# Patient Record
Sex: Male | Born: 1945 | Race: White | Hispanic: No | Marital: Married | Smoking: Former smoker
Health system: Southern US, Community
[De-identification: ages and names within clinical notes are randomized; demographics above are authoritative.]

## PROBLEM LIST (undated history)

## (undated) DIAGNOSIS — N400 Enlarged prostate without lower urinary tract symptoms: Secondary | ICD-10-CM

## (undated) DIAGNOSIS — N189 Chronic kidney disease, unspecified: Secondary | ICD-10-CM

## (undated) DIAGNOSIS — M1990 Unspecified osteoarthritis, unspecified site: Secondary | ICD-10-CM

## (undated) DIAGNOSIS — G2581 Restless legs syndrome: Secondary | ICD-10-CM

## (undated) DIAGNOSIS — D649 Anemia, unspecified: Secondary | ICD-10-CM

## (undated) DIAGNOSIS — Z950 Presence of cardiac pacemaker: Secondary | ICD-10-CM

## (undated) DIAGNOSIS — K219 Gastro-esophageal reflux disease without esophagitis: Secondary | ICD-10-CM

## (undated) DIAGNOSIS — N529 Male erectile dysfunction, unspecified: Secondary | ICD-10-CM

## (undated) DIAGNOSIS — I10 Essential (primary) hypertension: Secondary | ICD-10-CM

## (undated) DIAGNOSIS — F329 Major depressive disorder, single episode, unspecified: Secondary | ICD-10-CM

## (undated) DIAGNOSIS — F32A Depression, unspecified: Secondary | ICD-10-CM

## (undated) HISTORY — PX: BACK SURGERY: SHX140

## (undated) HISTORY — PX: INSERT / REPLACE / REMOVE PACEMAKER: SUR710

---

## 2016-08-01 ENCOUNTER — Other Ambulatory Visit: Payer: Self-pay | Admitting: Orthopedic Surgery

## 2016-08-01 DIAGNOSIS — M5441 Lumbago with sciatica, right side: Secondary | ICD-10-CM

## 2016-08-01 DIAGNOSIS — M5442 Lumbago with sciatica, left side: Secondary | ICD-10-CM

## 2016-08-04 ENCOUNTER — Ambulatory Visit
Admission: RE | Admit: 2016-08-04 | Discharge: 2016-08-04 | Disposition: A | Discharge status: Home / Self Care | Payer: Medicare HMO | Source: Ambulatory Visit | Attending: Orthopedic Surgery | Admitting: Orthopedic Surgery

## 2016-08-04 ENCOUNTER — Other Ambulatory Visit: Payer: Self-pay | Admitting: Orthopedic Surgery

## 2016-08-04 DIAGNOSIS — M5441 Lumbago with sciatica, right side: Secondary | ICD-10-CM

## 2016-08-04 DIAGNOSIS — M5442 Lumbago with sciatica, left side: Secondary | ICD-10-CM

## 2016-08-04 DIAGNOSIS — M961 Postlaminectomy syndrome, not elsewhere classified: Secondary | ICD-10-CM

## 2016-08-05 ENCOUNTER — Ambulatory Visit
Admission: RE | Admit: 2016-08-05 | Discharge: 2016-08-05 | Disposition: A | Discharge status: Home / Self Care | Payer: Medicare HMO | Source: Ambulatory Visit | Attending: Orthopedic Surgery | Admitting: Orthopedic Surgery

## 2016-08-05 DIAGNOSIS — M961 Postlaminectomy syndrome, not elsewhere classified: Secondary | ICD-10-CM

## 2016-08-05 MED ORDER — MEPERIDINE HCL 100 MG/ML IJ SOLN
75.0000 mg | Freq: Once | INTRAMUSCULAR | Status: AC
  Administered 2016-08-05: 75 mg via INTRAMUSCULAR

## 2016-08-05 MED ORDER — DIAZEPAM 5 MG PO TABS
5.0000 mg | ORAL_TABLET | Freq: Once | ORAL | Status: AC
  Administered 2016-08-05: 5 mg via ORAL

## 2016-08-05 MED ORDER — IOPAMIDOL (ISOVUE-M 200) INJECTION 41%
15.0000 mL | Freq: Once | INTRAMUSCULAR | Status: AC
  Administered 2016-08-05: 15 mL via INTRATHECAL

## 2016-08-05 MED ORDER — ONDANSETRON HCL 4 MG/2ML IJ SOLN: 4.0000 mg | Freq: Four times a day (QID) | INTRAMUSCULAR | Status: DC | PRN

## 2016-08-05 MED ORDER — ONDANSETRON HCL 4 MG/2ML IJ SOLN
4.0000 mg | Freq: Once | INTRAMUSCULAR | Status: AC
  Administered 2016-08-05: 4 mg via INTRAMUSCULAR

## 2016-08-05 NOTE — Discharge Instructions (Signed)

## 2016-08-07 ENCOUNTER — Telehealth: Payer: Self-pay | Admitting: Radiology

## 2016-08-07 NOTE — Telephone Encounter (Signed)
Denies any problems or headache post myelo

## 2016-08-20 ENCOUNTER — Ambulatory Visit: Payer: Self-pay | Admitting: Physician Assistant

## 2016-09-01 ENCOUNTER — Encounter (HOSPITAL_COMMUNITY)
Admission: RE | Admit: 2016-09-01 | Discharge: 2016-09-01 | Disposition: A | Discharge status: Home / Self Care | Payer: MEDICARE | Source: Ambulatory Visit | Attending: Orthopedic Surgery | Admitting: Orthopedic Surgery

## 2016-09-01 ENCOUNTER — Inpatient Hospital Stay (HOSPITAL_COMMUNITY)
Admission: RE | Admit: 2016-09-01 | Discharge: 2016-09-05 | DRG: 030 | Disposition: A | Discharge status: Home / Self Care | Payer: MEDICARE | Source: Ambulatory Visit | Attending: Orthopedic Surgery | Admitting: Orthopedic Surgery

## 2016-09-01 ENCOUNTER — Encounter (HOSPITAL_COMMUNITY): Payer: Self-pay

## 2016-09-01 DIAGNOSIS — M40209 Unspecified kyphosis, site unspecified: Secondary | ICD-10-CM | POA: Diagnosis present

## 2016-09-01 DIAGNOSIS — G8929 Other chronic pain: Secondary | ICD-10-CM | POA: Diagnosis present

## 2016-09-01 DIAGNOSIS — G894 Chronic pain syndrome: Principal | ICD-10-CM | POA: Diagnosis present

## 2016-09-01 DIAGNOSIS — I10 Essential (primary) hypertension: Secondary | ICD-10-CM | POA: Diagnosis present

## 2016-09-01 DIAGNOSIS — Z7982 Long term (current) use of aspirin: Secondary | ICD-10-CM | POA: Diagnosis not present

## 2016-09-01 DIAGNOSIS — Z950 Presence of cardiac pacemaker: Secondary | ICD-10-CM | POA: Diagnosis not present

## 2016-09-01 DIAGNOSIS — Z87891 Personal history of nicotine dependence: Secondary | ICD-10-CM | POA: Insufficient documentation

## 2016-09-01 DIAGNOSIS — M545 Low back pain, unspecified: Secondary | ICD-10-CM

## 2016-09-01 DIAGNOSIS — Z01812 Encounter for preprocedural laboratory examination: Secondary | ICD-10-CM

## 2016-09-01 DIAGNOSIS — K219 Gastro-esophageal reflux disease without esophagitis: Secondary | ICD-10-CM | POA: Diagnosis present

## 2016-09-01 DIAGNOSIS — M961 Postlaminectomy syndrome, not elsewhere classified: Secondary | ICD-10-CM | POA: Insufficient documentation

## 2016-09-01 DIAGNOSIS — M549 Dorsalgia, unspecified: Secondary | ICD-10-CM | POA: Diagnosis present

## 2016-09-01 DIAGNOSIS — Z79899 Other long term (current) drug therapy: Secondary | ICD-10-CM

## 2016-09-01 HISTORY — DX: Anemia, unspecified: D64.9

## 2016-09-01 HISTORY — DX: Essential (primary) hypertension: I10

## 2016-09-01 HISTORY — DX: Restless legs syndrome: G25.81

## 2016-09-01 HISTORY — DX: Chronic kidney disease, unspecified: N18.9

## 2016-09-01 HISTORY — DX: Gastro-esophageal reflux disease without esophagitis: K21.9

## 2016-09-01 HISTORY — DX: Unspecified osteoarthritis, unspecified site: M19.90

## 2016-09-01 HISTORY — DX: Benign prostatic hyperplasia without lower urinary tract symptoms: N40.0

## 2016-09-01 HISTORY — DX: Depression, unspecified: F32.A

## 2016-09-01 HISTORY — DX: Presence of cardiac pacemaker: Z95.0

## 2016-09-01 HISTORY — DX: Male erectile dysfunction, unspecified: N52.9

## 2016-09-01 HISTORY — DX: Major depressive disorder, single episode, unspecified: F32.9

## 2016-09-01 LAB — SURGICAL PCR SCREEN
MRSA, PCR: NEGATIVE
STAPHYLOCOCCUS AUREUS: POSITIVE — AB

## 2016-09-01 NOTE — Progress Notes (Signed)
Pacemaker form faxed to DR Preli. medtonic notified. Also labs in care everywhere.[facility]

## 2016-09-01 NOTE — Pre-Procedure Instructions (Signed)
    Deri FuellingClark Menter  09/01/2016      [facility] [phone] - [place] - [address] [facility] [address] [place] [id number] Phone: [phone] Fax: [phone]    Your procedure is scheduled on 09/03/16.  [facility] Admitting at 12 noon  Call this number if you have problems the morning of surgery:  [phone]   Remember:  Do not eat food or drink liquids after midnight.  Take these medicines the morning of surgery with A SIP OF WATER --valium,cymbalta,neurontin,oxycodone,protonix,flomax   Do not wear jewelry, make-up or nail polish.  Do not wear lotions, powders, or perfumes, or deoderant.  Do not shave 48 hours prior to surgery.  Men may shave face and neck.  Do not bring valuables to the hospital.  [facility] is not responsible for any belongings or valuables.  Contacts, dentures or bridgework may not be worn into surgery.  Leave your suitcase in the car.  After surgery it may be brought to your room.  For patients admitted to the hospital, discharge time will be determined by your treatment team.  Patients discharged the day of surgery will not be allowed to drive home.   Name and phone number of your driver:    Special instructions:  Do not take any aspirin,anti-inflammatories,vitamins,or herbal supplements 5-7 days prior to surgery.  Please read over the following fact sheets that you were given. MRSA Information

## 2016-09-02 NOTE — Progress Notes (Signed)
Anesthesia chart review: Patient is a 70 year old male scheduled for placement of spinal cord stimulator on 09/03/2016 by Dr. Shon Baton.   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 Carlos Levering with [facility]), GERD, depression, anemia, chronic kidney disease stage III, RLS, ED, BPH, L2-5 laminectomies with L2-4 PLIF 05/11/13 [facility]), re-exploration of decompressive L3-S1 laminectomies and removal of hardware 01/14/15 with readmission 01/27/15 with [facility]), L1-3 laminectomies 05/09/15 and C5 anterior corpectomy with C4-6 ACDF 07/28/15 [facility]). Prior cardiology notes from 55 and 2016 [facility]) indicate that PPM interrogations in the past has showed PAF (1%) with CHADS-VAS = 2. Anticoagulation was not considered at that time, partially due to history of falls. They also indicate that patient has a history of type II NSTEMI (demand ischemia) with coronary calcifications on chest CTA in 2003.   - PCP is Dr. Simmie Davies with [facility], last visit 08/27/16 for CPE (pre-op exam visit 08/13/16). He is aware of surgery plans, and signed a clearance note for surgery with recommendation for close BP monitoring. - Cardiologist is Dr. Juliane Lack with [facility], last visit 12/17/15 (newly established; see Care Everywhere). One year follow-up recommended. (Records indicate he had seen Dr. Rebecca Eaton in the past, and previous visit was with Dr. Collie Siad on 07/11/15 with [facility]. He doesn't think that he has had a cardiac cath. There was not record of a cardiac cath in Care Everywhere or [facility]. There was no cardiac records at [facility]. Last stress test in 2015.)  On 08/07/16 (Care Everywhere), "I have reviewed this device check and it shows adequate device function" Lear Ng, MD).   12/17/15 EKG ([facility]; Care Everywhere): Result Narrative  ONLY: (Tracing requested) Electronic ventricular pacemaker  Pacemaker ECG, No further analysis  INSUFFICIENT DATA  01/29/15 Echo [facility]; Care Everywhere): Interpretation Summary A complete portable two-dimensional transthoracic echocardiogram was performed. The study was technically adequate. Compared to prior there is mildly improved LV function. The mitral valve is normal in structure with mild to moderate [1-2+] regurgitation. There is a pacemaker lead in the right ventricle. The aortic valve is not well visualized, but is grossly normal. The tricupid valve leaflets are structurally normal with moderate (2+) regurgitation. The left ventricle is mildly dilated. There is normal left ventricular wall thickness. The left ventricular ejection fraction is normal (50-55%). The left ventricular diastolic function is abnormal. The left atrium is moderately dilated. The right atrium is moderately dilated. Right ventricular systolic pressure is elevated between 40-86mm Hg, consistent with moderate pulmonary hypertension.  03/01/14 Nuclear stress test [facility]; done at [facility]): Impression: 1. The study is inconclusive for myocardial ischemia given the relative diffuse difference in radiotracer count density on the stress versus rest imaging. 2. Normal left ventricular function with calculated EF of 55%. 3. No focal reversible perfusion defects.  11/17/15 CT Chest/Abd/Pelvis ([facility]; Care Everywhere): IMPRESSION: 1. No acute infiltrate or effusion. 2. Stable atherosclerotic disease. 3. No significant change postoperative/degenerative changes lumbar spine. 4. Diverticulosis sigmoid colon.  11/17/15 CXR ([facility]; Care Everywhere): FINDINGS: Image obtained in mid expiration. Cardiac size stable. Stable right-sided pacemaker. Mild decrease bibasilar infiltrate/atelectasis. IMPRESSION: Mild decrease bibasilar infiltrate/atelectasis.  Patient had  labs on 08/27/16 as part of his CPE with Dr. Magdalene Patricia [facility]; see Care Everywhere). Labs showed WBC 4.5, hemoglobin 11.7, hematocrit 34.7, platelet count 229. BMI of 30, creatinine 1.73 (Cr 1.27-2.15  since 01/2015; although most recently in the 1.3-1.4 range), potassium 4.3, AST 38, ALP 21, glucose 97.  January EKG tracing and PPM perioperative device form is still pending from Dr. Reuben LikesPreli's office. If no EKG tracing received within the past year, then he will need a baseline tracing done prior to surgery.   Velna Ochsllison Zohaib Heeney, PA-C [facility]/Anesthesiology Phone [phone] 09/02/2016 1:15 PM

## 2016-09-03 ENCOUNTER — Observation Stay (HOSPITAL_COMMUNITY): Payer: MEDICARE

## 2016-09-03 ENCOUNTER — Encounter (HOSPITAL_COMMUNITY)
Admission: RE | Disposition: A | Discharge status: Home / Self Care | Payer: Self-pay | Source: Ambulatory Visit | Attending: Orthopedic Surgery

## 2016-09-03 ENCOUNTER — Ambulatory Visit (HOSPITAL_COMMUNITY): Payer: MEDICARE | Admitting: Vascular Surgery

## 2016-09-03 ENCOUNTER — Encounter (HOSPITAL_COMMUNITY): Payer: Self-pay | Admitting: *Deleted

## 2016-09-03 ENCOUNTER — Ambulatory Visit (HOSPITAL_COMMUNITY): Payer: MEDICARE | Admitting: Anesthesiology

## 2016-09-03 DIAGNOSIS — M40209 Unspecified kyphosis, site unspecified: Secondary | ICD-10-CM | POA: Diagnosis not present

## 2016-09-03 DIAGNOSIS — K219 Gastro-esophageal reflux disease without esophagitis: Secondary | ICD-10-CM | POA: Diagnosis not present

## 2016-09-03 DIAGNOSIS — M549 Dorsalgia, unspecified: Secondary | ICD-10-CM | POA: Diagnosis present

## 2016-09-03 DIAGNOSIS — I10 Essential (primary) hypertension: Secondary | ICD-10-CM | POA: Diagnosis not present

## 2016-09-03 DIAGNOSIS — G894 Chronic pain syndrome: Secondary | ICD-10-CM | POA: Diagnosis not present

## 2016-09-03 HISTORY — PX: SPINAL CORD STIMULATOR INSERTION: SHX5378

## 2016-09-03 SURGERY — INSERTION, SPINAL CORD STIMULATOR, LUMBAR
Anesthesia: General | Site: Back

## 2016-09-03 MED ORDER — MENTHOL 3 MG MT LOZG: 1.0000 | LOZENGE | OROMUCOSAL | Status: DC | PRN

## 2016-09-03 MED ORDER — LIDOCAINE HCL (CARDIAC) 20 MG/ML IV SOLN
INTRAVENOUS | Status: DC | PRN
  Administered 2016-09-03: 60 mg via INTRAVENOUS

## 2016-09-03 MED ORDER — GABAPENTIN 300 MG PO CAPS
300.0000 mg | ORAL_CAPSULE | Freq: Two times a day (BID) | ORAL | Status: DC
  Administered 2016-09-03 – 2016-09-05 (×4): 300 mg via ORAL
  Filled 2016-09-03 (×5): qty 1

## 2016-09-03 MED ORDER — THROMBIN 20000 UNITS EX SOLR
CUTANEOUS | Status: DC | PRN
  Administered 2016-09-03: 20000 [IU] via TOPICAL

## 2016-09-03 MED ORDER — ACETAMINOPHEN 10 MG/ML IV SOLN
INTRAVENOUS | Status: DC | PRN
  Administered 2016-09-03: 1000 mg via INTRAVENOUS

## 2016-09-03 MED ORDER — DULOXETINE HCL 60 MG PO CPEP
60.0000 mg | ORAL_CAPSULE | Freq: Every day | ORAL | Status: DC
  Administered 2016-09-03 – 2016-09-05 (×3): 60 mg via ORAL
  Filled 2016-09-03 (×3): qty 1

## 2016-09-03 MED ORDER — FENTANYL CITRATE (PF) 100 MCG/2ML IJ SOLN
25.0000 ug | INTRAMUSCULAR | Status: DC | PRN
  Administered 2016-09-03 (×2): 50 ug via INTRAVENOUS

## 2016-09-03 MED ORDER — ROPINIROLE HCL 1 MG PO TABS
3.0000 mg | ORAL_TABLET | ORAL | Status: DC
  Administered 2016-09-03 – 2016-09-05 (×4): 3 mg via ORAL
  Filled 2016-09-03 (×5): qty 3

## 2016-09-03 MED ORDER — DEXAMETHASONE SODIUM PHOSPHATE 10 MG/ML IJ SOLN
INTRAMUSCULAR | Status: DC | PRN
  Administered 2016-09-03: 8 mg via INTRAVENOUS

## 2016-09-03 MED ORDER — ACETAMINOPHEN 10 MG/ML IV SOLN
INTRAVENOUS | Status: AC
  Filled 2016-09-03: qty 100

## 2016-09-03 MED ORDER — HEMOSTATIC AGENTS (NO CHARGE) OPTIME
TOPICAL | Status: DC | PRN
  Administered 2016-09-03: 1 via TOPICAL

## 2016-09-03 MED ORDER — TAMSULOSIN HCL 0.4 MG PO CAPS
0.4000 mg | ORAL_CAPSULE | Freq: Every day | ORAL | Status: DC
  Administered 2016-09-03 – 2016-09-04 (×2): 0.4 mg via ORAL
  Filled 2016-09-03 (×2): qty 1

## 2016-09-03 MED ORDER — EPHEDRINE 5 MG/ML INJ
INTRAVENOUS | Status: AC
  Filled 2016-09-03: qty 10

## 2016-09-03 MED ORDER — CEFAZOLIN IN D5W 1 GM/50ML IV SOLN
1.0000 g | Freq: Three times a day (TID) | INTRAVENOUS | Status: AC
  Administered 2016-09-03 – 2016-09-04 (×2): 1 g via INTRAVENOUS
  Filled 2016-09-03 (×2): qty 50

## 2016-09-03 MED ORDER — ONDANSETRON HCL 4 MG/2ML IJ SOLN: 4.0000 mg | Freq: Once | INTRAMUSCULAR | Status: DC | PRN

## 2016-09-03 MED ORDER — MIDAZOLAM HCL 5 MG/5ML IJ SOLN
INTRAMUSCULAR | Status: DC | PRN
  Administered 2016-09-03: 2 mg via INTRAVENOUS

## 2016-09-03 MED ORDER — ONDANSETRON HCL 4 MG/2ML IJ SOLN
INTRAMUSCULAR | Status: AC
  Filled 2016-09-03: qty 2

## 2016-09-03 MED ORDER — PANTOPRAZOLE SODIUM 40 MG PO TBEC
40.0000 mg | DELAYED_RELEASE_TABLET | Freq: Every day | ORAL | Status: DC
  Administered 2016-09-03 – 2016-09-05 (×3): 40 mg via ORAL
  Filled 2016-09-03 (×3): qty 1

## 2016-09-03 MED ORDER — SODIUM CHLORIDE 0.9% FLUSH: 3.0000 mL | INTRAVENOUS | Status: DC | PRN

## 2016-09-03 MED ORDER — PHENYLEPHRINE HCL 10 MG/ML IJ SOLN
INTRAMUSCULAR | Status: DC | PRN
  Administered 2016-09-03 (×2): 80 ug via INTRAVENOUS
  Administered 2016-09-03: 40 ug via INTRAVENOUS
  Administered 2016-09-03: 80 ug via INTRAVENOUS
  Administered 2016-09-03: 40 ug via INTRAVENOUS
  Administered 2016-09-03 (×2): 80 ug via INTRAVENOUS

## 2016-09-03 MED ORDER — CEFAZOLIN SODIUM-DEXTROSE 2-4 GM/100ML-% IV SOLN
2.0000 g | INTRAVENOUS | Status: AC
  Administered 2016-09-03: 2 g via INTRAVENOUS
  Filled 2016-09-03: qty 100

## 2016-09-03 MED ORDER — DEXAMETHASONE 4 MG PO TABS
4.0000 mg | ORAL_TABLET | Freq: Four times a day (QID) | ORAL | Status: DC
  Administered 2016-09-04: 4 mg via ORAL
  Filled 2016-09-03 (×2): qty 1

## 2016-09-03 MED ORDER — PHENYLEPHRINE 40 MCG/ML (10ML) SYRINGE FOR IV PUSH (FOR BLOOD PRESSURE SUPPORT)
PREFILLED_SYRINGE | INTRAVENOUS | Status: AC
  Filled 2016-09-03: qty 20

## 2016-09-03 MED ORDER — OXYCODONE HCL 5 MG PO TABS
15.0000 mg | ORAL_TABLET | Freq: Three times a day (TID) | ORAL | Status: DC | PRN
  Administered 2016-09-04: 30 mg via ORAL
  Filled 2016-09-03: qty 6

## 2016-09-03 MED ORDER — OXYCODONE HCL 5 MG PO TABS: 15.0000 mg | ORAL_TABLET | Freq: Once | ORAL | Status: DC | PRN

## 2016-09-03 MED ORDER — DIAZEPAM 5 MG PO TABS
10.0000 mg | ORAL_TABLET | Freq: Three times a day (TID) | ORAL | Status: DC | PRN
  Administered 2016-09-03 – 2016-09-05 (×4): 10 mg via ORAL
  Filled 2016-09-03 (×4): qty 2

## 2016-09-03 MED ORDER — ROCURONIUM BROMIDE 100 MG/10ML IV SOLN
INTRAVENOUS | Status: DC | PRN
  Administered 2016-09-03: 40 mg via INTRAVENOUS
  Administered 2016-09-03 (×2): 10 mg via INTRAVENOUS

## 2016-09-03 MED ORDER — DEXAMETHASONE SODIUM PHOSPHATE 10 MG/ML IJ SOLN
INTRAMUSCULAR | Status: AC
  Filled 2016-09-03: qty 1

## 2016-09-03 MED ORDER — ONDANSETRON HCL 4 MG/2ML IJ SOLN
INTRAMUSCULAR | Status: DC | PRN
  Administered 2016-09-03: 4 mg via INTRAVENOUS

## 2016-09-03 MED ORDER — FENTANYL CITRATE (PF) 100 MCG/2ML IJ SOLN
INTRAMUSCULAR | Status: AC
  Filled 2016-09-03: qty 2

## 2016-09-03 MED ORDER — DEXAMETHASONE SODIUM PHOSPHATE 4 MG/ML IJ SOLN
4.0000 mg | Freq: Four times a day (QID) | INTRAMUSCULAR | Status: DC
  Administered 2016-09-03 – 2016-09-05 (×6): 4 mg via INTRAVENOUS
  Filled 2016-09-03 (×6): qty 1

## 2016-09-03 MED ORDER — MORPHINE-NALTREXONE 20-0.8 MG PO CPCR: 1.0000 | ORAL_CAPSULE | Freq: Two times a day (BID) | ORAL | Status: DC

## 2016-09-03 MED ORDER — FENTANYL CITRATE (PF) 100 MCG/2ML IJ SOLN
INTRAMUSCULAR | Status: DC | PRN
  Administered 2016-09-03: 50 ug via INTRAVENOUS
  Administered 2016-09-03: 25 ug via INTRAVENOUS
  Administered 2016-09-03: 50 ug via INTRAVENOUS
  Administered 2016-09-03: 100 ug via INTRAVENOUS
  Administered 2016-09-03: 50 ug via INTRAVENOUS
  Administered 2016-09-03: 25 ug via INTRAVENOUS

## 2016-09-03 MED ORDER — SUCCINYLCHOLINE CHLORIDE 200 MG/10ML IV SOSY
PREFILLED_SYRINGE | INTRAVENOUS | Status: AC
  Filled 2016-09-03: qty 20

## 2016-09-03 MED ORDER — BUPIVACAINE-EPINEPHRINE 0.25% -1:200000 IJ SOLN
INTRAMUSCULAR | Status: DC | PRN
  Administered 2016-09-03: 10 mL

## 2016-09-03 MED ORDER — 0.9 % SODIUM CHLORIDE (POUR BTL) OPTIME
TOPICAL | Status: DC | PRN
  Administered 2016-09-03: 1000 mL

## 2016-09-03 MED ORDER — PHENOL 1.4 % MT LIQD: 1.0000 | OROMUCOSAL | Status: DC | PRN

## 2016-09-03 MED ORDER — LACTATED RINGERS IV SOLN: INTRAVENOUS | Status: DC

## 2016-09-03 MED ORDER — SODIUM CHLORIDE 0.9% FLUSH
3.0000 mL | Freq: Two times a day (BID) | INTRAVENOUS | Status: DC
  Administered 2016-09-03 – 2016-09-05 (×4): 3 mL via INTRAVENOUS

## 2016-09-03 MED ORDER — MIDAZOLAM HCL 2 MG/2ML IJ SOLN
INTRAMUSCULAR | Status: AC
  Filled 2016-09-03: qty 2

## 2016-09-03 MED ORDER — SUGAMMADEX SODIUM 200 MG/2ML IV SOLN
INTRAVENOUS | Status: AC
  Filled 2016-09-03: qty 2

## 2016-09-03 MED ORDER — THROMBIN 20000 UNITS EX SOLR
CUTANEOUS | Status: AC
  Filled 2016-09-03: qty 20000

## 2016-09-03 MED ORDER — SODIUM CHLORIDE 0.9 % IV SOLN
INTRAVENOUS | Status: DC | PRN
  Administered 2016-09-03: 17:00:00 via INTRAVENOUS

## 2016-09-03 MED ORDER — OXYCODONE HCL 5 MG/5ML PO SOLN: 15.0000 mg | Freq: Once | ORAL | Status: DC | PRN

## 2016-09-03 MED ORDER — BUPIVACAINE-EPINEPHRINE (PF) 0.25% -1:200000 IJ SOLN
INTRAMUSCULAR | Status: AC
  Filled 2016-09-03: qty 30

## 2016-09-03 MED ORDER — ONDANSETRON HCL 4 MG/2ML IJ SOLN
4.0000 mg | INTRAMUSCULAR | Status: DC | PRN
  Administered 2016-09-04 (×2): 4 mg via INTRAVENOUS
  Filled 2016-09-03 (×2): qty 2

## 2016-09-03 MED ORDER — MORPHINE SULFATE ER 15 MG PO TBCR
15.0000 mg | EXTENDED_RELEASE_TABLET | Freq: Two times a day (BID) | ORAL | Status: DC
  Administered 2016-09-03 – 2016-09-04 (×2): 15 mg via ORAL
  Filled 2016-09-03 (×2): qty 1

## 2016-09-03 MED ORDER — PROPOFOL 10 MG/ML IV BOLUS
INTRAVENOUS | Status: AC
  Filled 2016-09-03: qty 40

## 2016-09-03 MED ORDER — SODIUM CHLORIDE 0.9 % IV SOLN: 250.0000 mL | INTRAVENOUS | Status: DC

## 2016-09-03 MED ORDER — ROCURONIUM BROMIDE 10 MG/ML (PF) SYRINGE
PREFILLED_SYRINGE | INTRAVENOUS | Status: AC
  Filled 2016-09-03: qty 10

## 2016-09-03 MED ORDER — PHENYLEPHRINE 40 MCG/ML (10ML) SYRINGE FOR IV PUSH (FOR BLOOD PRESSURE SUPPORT)
PREFILLED_SYRINGE | INTRAVENOUS | Status: AC
  Filled 2016-09-03: qty 40

## 2016-09-03 MED ORDER — PROPOFOL 10 MG/ML IV BOLUS
INTRAVENOUS | Status: DC | PRN
  Administered 2016-09-03: 170 mg via INTRAVENOUS

## 2016-09-03 SURGICAL SUPPLY — 69 items
ANCHOR CLICK (Anchor) ×2 IMPLANT
ANCHOR CLIK NEURO F/LEAD 2 (Anchor) ×1 IMPLANT
CANISTER SUCTION 2500CC (MISCELLANEOUS) ×3 IMPLANT
CLOSURE STERI-STRIP 1/2X4 (GAUZE/BANDAGES/DRESSINGS) ×2
CLSR STERI-STRIP ANTIMIC 1/2X4 (GAUZE/BANDAGES/DRESSINGS) ×4 IMPLANT
COVER PROBE W GEL 5X96 (DRAPES) IMPLANT
COVER SURGICAL LIGHT HANDLE (MISCELLANEOUS) ×3 IMPLANT
DRAPE C-ARM 42X72 X-RAY (DRAPES) ×3 IMPLANT
DRAPE C-ARMOR (DRAPES) ×3 IMPLANT
DRAPE INCISE IOBAN 85X60 (DRAPES) ×3 IMPLANT
DRAPE SURG 17X23 STRL (DRAPES) ×3 IMPLANT
DRAPE U-SHAPE 47X51 STRL (DRAPES) ×3 IMPLANT
DRSG AQUACEL AG ADV 3.5X 4 (GAUZE/BANDAGES/DRESSINGS) ×3 IMPLANT
DRSG AQUACEL AG ADV 3.5X 6 (GAUZE/BANDAGES/DRESSINGS) ×3 IMPLANT
DRSG AQUACEL AG ADV 3.5X10 (GAUZE/BANDAGES/DRESSINGS) ×3 IMPLANT
DURAPREP 26ML APPLICATOR (WOUND CARE) ×3 IMPLANT
ELECT BLADE 4.0 EZ CLEAN MEGAD (MISCELLANEOUS) ×3
ELECT CAUTERY BLADE 6.4 (BLADE) ×3 IMPLANT
ELECT PENCIL ROCKER SW 15FT (MISCELLANEOUS) ×3 IMPLANT
ELECT REM PT RETURN 9FT ADLT (ELECTROSURGICAL) ×3
ELECTRODE BLDE 4.0 EZ CLN MEGD (MISCELLANEOUS) ×1 IMPLANT
ELECTRODE REM PT RTRN 9FT ADLT (ELECTROSURGICAL) ×1 IMPLANT
ELEVATER PASSER (SPINAL CORD STIMULATOR) ×3
GLOVE BIOGEL PI IND STRL 8.5 (GLOVE) ×1 IMPLANT
GLOVE BIOGEL PI INDICATOR 8.5 (GLOVE) ×2
GLOVE SS N UNI LF 8.5 STRL (GLOVE) ×6 IMPLANT
GOWN STRL REUS W/TWL 2XL LVL3 (GOWN DISPOSABLE) ×3 IMPLANT
IPG PRECISION SPECTRA (Stimulator) ×3 IMPLANT
KIT BASIN OR (CUSTOM PROCEDURE TRAY) ×3 IMPLANT
KIT CHARGING (KITS) ×2
KIT CHARGING PRECISION NEURO (KITS) ×1 IMPLANT
KIT PAT PROGRAM FREELINK (KITS) ×1 IMPLANT
KIT ROOM TURNOVER OR (KITS) ×3 IMPLANT
LEAD PERCUTANEOUS (Neuro Prosthesis/Implant) ×6 IMPLANT
LEAD SURG ARTISAN 70CM (SPINAL CORD STIMULATOR) ×3 IMPLANT
NEEDLE 22X1 1/2 (OR ONLY) (NEEDLE) ×3 IMPLANT
NEEDLE SPNL 18GX3.5 QUINCKE PK (NEEDLE) ×9 IMPLANT
NS IRRIG 1000ML POUR BTL (IV SOLUTION) ×3 IMPLANT
PACK LAMINECTOMY ORTHO (CUSTOM PROCEDURE TRAY) ×3 IMPLANT
PACK UNIVERSAL I (CUSTOM PROCEDURE TRAY) ×3 IMPLANT
PAD ARMBOARD 7.5X6 YLW CONV (MISCELLANEOUS) ×9 IMPLANT
PADDLE BLANK SIMULATOR LEAD 32 (MISCELLANEOUS) ×3 IMPLANT
PASSER ELEVATOR (SPINAL CORD STIMULATOR) ×1 IMPLANT
PATTIES SURGICAL .5 X1 (DISPOSABLE) ×3 IMPLANT
REMOTE CONTROL KIT (KITS) ×3
SPATULA SILICONE BRAIN 10MM (MISCELLANEOUS) ×3 IMPLANT
SPONGE LAP 4X18 X RAY DECT (DISPOSABLE) IMPLANT
SPONGE SURGIFOAM ABS GEL 100 (HEMOSTASIS) ×3 IMPLANT
STAPLER VISISTAT 35W (STAPLE) ×3 IMPLANT
SURGIFLO W/THROMBIN 8M KIT (HEMOSTASIS) ×6 IMPLANT
SUT BONE WAX W31G (SUTURE) ×3 IMPLANT
SUT FIBERWIRE #2 38 T-5 BLUE (SUTURE) ×3
SUT MNCRL AB 3-0 PS2 18 (SUTURE) ×3 IMPLANT
SUT MON AB 3-0 SH 27 (SUTURE) ×2
SUT MON AB 3-0 SH27 (SUTURE) ×1 IMPLANT
SUT VIC AB 1 CT1 18XCR BRD 8 (SUTURE) ×2 IMPLANT
SUT VIC AB 1 CT1 27 (SUTURE) ×2
SUT VIC AB 1 CT1 27XBRD ANBCTR (SUTURE) ×1 IMPLANT
SUT VIC AB 1 CT1 8-18 (SUTURE) ×4
SUT VIC AB 2-0 CT1 18 (SUTURE) ×9 IMPLANT
SUTURE FIBERWR #2 38 T-5 BLUE (SUTURE) ×1 IMPLANT
SYR BULB IRRIGATION 50ML (SYRINGE) ×3 IMPLANT
SYR CONTROL 10ML LL (SYRINGE) ×3 IMPLANT
TOOL LONG TUNNEL (SPINAL CORD STIMULATOR) ×3 IMPLANT
TOWEL OR 17X24 6PK STRL BLUE (TOWEL DISPOSABLE) ×3 IMPLANT
TOWEL OR 17X26 10 PK STRL BLUE (TOWEL DISPOSABLE) ×3 IMPLANT
TRAY FOLEY CATH 16FRSI W/METER (SET/KITS/TRAYS/PACK) IMPLANT
WATER STERILE IRR 1000ML POUR (IV SOLUTION) ×3 IMPLANT
WRENCH HEX 7.6 (SPINAL CORD STIMULATOR) ×3 IMPLANT

## 2016-09-03 NOTE — H&P (Signed)
History of Present Illness The patient is a 70 year old male who comes in today for a preoperative History and Physical. The patient is scheduled for a Permanent Implantation of SCS to be performed by Dr. Debria Garretahari D. Shon BatonBrooks, [facility]  on 09/03/16 . Please see the hospital record for complete dictated history and physical. Pt has a pacemaker.  Transition into care is described as the following: The patient is transitioning into care and a summary of care was reviewed.   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 Depression  Father, Mother. Drug / Alcohol Addiction  Father. First Degree Relatives  reported Heart disease in male family member before age 71  Hypertension  Brother, Mother. Osteoporosis  Mother.  Social History  Tobacco use  Former smoker. 07/25/2016: smoke(d) 1 pack(s) per day Tobacco / smoke exposure  07/25/2016: no Children  1 Current drinker  07/25/2016: Currently drinks hard liquor less than 5 times per week Current work status  retired Exercise  Exercises rarely; does individual sport Living situation  live with spouse Marital status  married No history of drug/alcohol rehab  Number of flights of stairs before winded  greater than 5 Under pain contract   Medication History ROPINIRole HCl (3MG  Tablet, Oral) Active. Losartan Potassium (100MG  Tablet, Oral) Active. Pantoprazole Sodium (40MG  Tablet DR, Oral) Active. Fenofibrate (160MG  Tablet, Oral) Active. Clobetasol Propionate (0.05% Ointment, External) Active. Diclofenac (75MG  Tablet, Oral) Active. OxyCODONE HCl (15MG  Tablet, Oral) Active. Gabapentin (300MG  Capsule, Oral) Active. DULoxetine HCl (60MG  Capsule DR Part, Oral)  Active. DiazePAM (10MG  Tablet, Oral) Active.  Vitals  08/26/2016 3:05 PM Weight: 153 lb Height: 65in Body Surface Area: 1.77 m Body Mass Index: 25.46 kg/m  Temp.: 98.79F  Pulse: 75 (Regular)  BP: 145/95 (Sitting, Left Arm, Standard)  General General Appearance-Not in acute distress. Orientation-Oriented X3. Build & Nutrition-Well nourished and Well developed.  Integumentary General Characteristics Surgical Scars - surgical scarring consistent with previous lumbar surgery. Lumbar Spine-Skin examination of the lumbar spine is without deformity, skin lesions, lacerations or abrasions. Note: pocket of fluid noted on palpation   Chest and Lung Exam Auscultation Breath sounds - Normal and Clear.  Cardiovascular Auscultation Rhythm - Regular rate and rhythm.  Abdomen Palpation/Percussion Palpation and Percussion of the abdomen reveal - Soft, Non Tender and No Rebound tenderness.  Peripheral Vascular Lower Extremity Palpation - Posterior tibial pulse - Bilateral - 2+. Dorsalis pedis pulse - Bilateral - 2+.  Neurologic Sensation Lower Extremity - Bilateral - sensation is diminished in the lower extremity. Reflexes Patellar Reflex - Bilateral - 2+. Achilles Reflex - Bilateral - 2+. Clonus - Bilateral - clonus not present. Hoffman's Sign - Bilateral - Hoffman's sign not present. Testing Seated Straight Leg Raise - Bilateral - Seated straight leg raise negative.  Musculoskeletal Spine/Ribs/Pelvis  Lumbosacral Spine: Inspection and Palpation - Tenderness - thoracic spinous processes tender to palpation(protrusion TTP), left lumbar paraspinals tender to palpation and right lumbar paraspinals tender to palpation. Strength and Tone: Strength - Hip Flexion - Bilateral - 5/5. Knee Extension - Bilateral - 5/5. Knee Flexion - Bilateral - 5/5. Ankle Dorsiflexion - Left - 5/5. Right - 3-/5. Ankle Plantarflexion - Bilateral - 5/5. Heel walk - Bilateral - unable to  heel walk. Toe Walk - Bilateral - unable to walk  on toes. Heel-Toe Walk - Bilateral - unable to heel-toe walk. ROM - Flexion - severely decreased range of motion and painful. Extension - severely decreased range of motion and painful. Left Lateral Bending - severely decreased range of motion and painful. Right Lateral Bending - severely decreased range of motion and painful. Right Rotation - severely decreased range of motion and painful. Left Rotation - severely decreased range of motion and painful. Pain - . Lumbosacral Spine - Waddell's Signs - no Waddell's signs present. Lower Extremity Range of Motion - No true hip, knee or ankle pain with range of motion. Gait and Station - [employer] - cane.  CT myelogram of the lumbar spine again confirms the extensive postsurgical changes seen. The fluid collections in the laminectomy bed from L2-4 and in the subcutaneous tissues from L1 through L4 most likely are postoperative seromas. There is no opacification of these collections to indicate a pseudomeningocele. Essentially the dye that was inserted into the thecal sac did not communicate with these fluid collections. There is severe disc endplate degeneration at L1-2 with a grade 1 borderline 2 retrolisthesis. No other significant lumbar stenosis.  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  CT Thoracic: no significant stenosis to prevent implantation of SCS  Plans Transcription At this point, he also had a solid osseous fusion L2-3, L3-4. At this point in time, given the fact that he does not have a large pseudomeningocele. I think it is reasonable to move forward with spinal cord stimulator. We have reviewed the risks, which include infection, bleeding, nerve damage, death, stroke, paralysis, ongoing or worse pain, need for further surgery,  no relief in symptoms. All of his and his wife's questions were addressed.

## 2016-09-03 NOTE — Progress Notes (Signed)
Dylan Hoover with [employer] at bedside. Pacemaker interrogated and documentation placed in chart.

## 2016-09-03 NOTE — Anesthesia Procedure Notes (Signed)
Procedure Name: Intubation Date/Time: 09/03/2016 5:15 PM Performed by: Tillman AbideHAWKINS, Kylee Nardozzi B Pre-anesthesia Checklist: Patient identified, Emergency Drugs available, Suction available and Patient being monitored Patient Re-evaluated:Patient Re-evaluated prior to inductionOxygen Delivery Method: Circle System Utilized Preoxygenation: Pre-oxygenation with 100% oxygen Intubation Type: IV induction Ventilation: Mask ventilation without difficulty Laryngoscope Size: Mac and 4 Grade View: Grade I Tube type: Oral Tube size: 7.5 mm Number of attempts: 1 Airway Equipment and Method: Stylet Placement Confirmation: ETT inserted through vocal cords under direct vision,  positive ETCO2 and breath sounds checked- equal and bilateral Secured at: 24 cm Tube secured with: Tape Dental Injury: Teeth and Oropharynx as per pre-operative assessment  Difficulty Due To: Difficulty was unanticipated

## 2016-09-03 NOTE — Progress Notes (Signed)
Pt arrived post surgery around 2145, alert and oriented having 8 on 0-10 scale pain, able to move all extrimities no new onset of numbness, made patient comfortable will continue to monitor.

## 2016-09-03 NOTE — Anesthesia Preprocedure Evaluation (Addendum)
Anesthesia Evaluation  Patient identified by MRN, date of birth, ID band Patient awake    Reviewed: Allergy & Precautions, NPO status , Patient's Chart, lab work & pertinent test results  Airway Mallampati: II  TM Distance: >3 FB Neck ROM: Full    Dental  (+) Dental Advisory Given   Pulmonary former smoker,    breath sounds clear to auscultation       Cardiovascular hypertension, Pt. on medications + dysrhythmias + pacemaker  Rhythm:Regular Rate:Normal     Neuro/Psych Depression negative neurological ROS     GI/Hepatic Neg liver ROS, GERD  ,  Endo/Other  negative endocrine ROS  Renal/GU Renal InsufficiencyRenal disease     Musculoskeletal  (+) Arthritis ,   Abdominal   Peds  Hematology negative hematology ROS (+)   Anesthesia Other Findings   Reproductive/Obstetrics                          No results found for: WBC, HGB, HCT, MCV, PLT No results found for: CREATININE, BUN, NA, K, CL, CO2 labs on 08/27/16 as part of his CPE with Dr. [facility]; see Care Everywhere). Labs showed WBC 4.5, hemoglobin 11.7, hematocrit 34.7, platelet count 229. BMI of 30, creatinine 1.73 (Cr 1.27-2.15 since 01/2015; although most recently in the 1.3-1.4 range), potassium 4.3, AST 38, ALP 21, glucose 97. Anesthesia Physical Anesthesia Plan  ASA: III  Anesthesia Plan: General   Post-op Pain Management:    Induction: Intravenous  Airway Management Planned: Oral ETT  Additional Equipment:   Intra-op Plan:   Post-operative Plan: Extubation in OR  Informed Consent: I have reviewed the patients History and Physical, chart, labs and discussed the procedure including the risks, benefits and alternatives for the proposed anesthesia with the patient or authorized representative who has indicated his/her understanding and acceptance.   Dental advisory given  Plan Discussed with: CRNA  Anesthesia  Plan Comments:        Anesthesia Quick Evaluation

## 2016-09-03 NOTE — Brief Op Note (Signed)
09/03/2016  7:44 PM  PATIENT:  Dylan Hoover  70 y.o. male  PRE-OPERATIVE DIAGNOSIS:  CHRONIC PAIN,FAILED BACK SYNDROME  POST-OPERATIVE DIAGNOSIS:  CHRONIC PAIN,FAILED BACK SYNDROME  PROCEDURE:  Procedure(s): LUMBAR SPINAL CORD STIMULATOR PLACEMENT (N/A)  SURGEON:  Surgeon(s) and Role:    * Venita Lickahari Nevin Kozuch, MD - Primary  PHYSICIAN ASSISTANT:   ASSISTANTS: carmen mayo   ANESTHESIA:   general  EBL:  Total I/O In: 800 [I.V.:800] Out: 10 [Blood:10]  BLOOD ADMINISTERED:none  DRAINS: none   LOCAL MEDICATIONS USED:  MARCAINE     SPECIMEN:  No Specimen  DISPOSITION OF SPECIMEN:  N/A  COUNTS:  YES  TOURNIQUET:  * No tourniquets in log *  DICTATION: .Other Dictation: Dictation Number [id number]  PLAN OF CARE: Admit for overnight observation  PATIENT DISPOSITION:  PACU - hemodynamically stable.

## 2016-09-03 NOTE — Transfer of Care (Signed)
Immediate Anesthesia Transfer of Care Note  Patient: Dylan Hoover  Procedure(s) Performed: Procedure(s): LUMBAR SPINAL CORD STIMULATOR PLACEMENT (N/A)  Patient Location: PACU  Anesthesia Type:General  Level of Consciousness: awake, alert , oriented and patient cooperative  Airway & Oxygen Therapy: Patient Spontanous Breathing and Patient connected to nasal cannula oxygen  Post-op Assessment: Report given to RN and Post -op Vital signs reviewed and stable  Post vital signs: Reviewed and stable  Last Vitals:  Vitals:   09/03/16 1250 09/03/16 2027  BP: 139/61 (!) 142/76  Pulse: 61 62  Resp: 20 11  Temp: 37.1 C 36.4 C    Last Pain:  Vitals:   09/03/16 1301  TempSrc:   PainSc: 5       Patients Stated Pain Goal: 3 (09/03/16 1301)  Complications: No apparent anesthesia complications   Treated for lower back pain 8/10 in PACU 50mcg fentanyl

## 2016-09-03 NOTE — Anesthesia Postprocedure Evaluation (Signed)
Anesthesia Post Note  Patient: Dylan Hoover  Procedure(s) Performed: Procedure(s) (LRB): LUMBAR SPINAL CORD STIMULATOR PLACEMENT (N/A)  Patient location during evaluation: PACU Anesthesia Type: General Level of consciousness: awake and alert Pain management: pain level controlled Vital Signs Assessment: post-procedure vital signs reviewed and stable Respiratory status: spontaneous breathing, nonlabored ventilation, respiratory function stable and patient connected to nasal cannula oxygen Cardiovascular status: blood pressure returned to baseline and stable Postop Assessment: no signs of nausea or vomiting Anesthetic complications: no    Last Vitals:  Vitals:   09/03/16 2057 09/03/16 2103  BP: (!) 148/72 (!) 146/69  Pulse: 60 (!) 59  Resp: 18 16  Temp:  36.7 C    Last Pain:  Vitals:   09/03/16 2103  TempSrc:   PainSc: Asleep                 Cecile HearingStephen Edward Aamari Strawderman

## 2016-09-04 DIAGNOSIS — G894 Chronic pain syndrome: Secondary | ICD-10-CM | POA: Diagnosis not present

## 2016-09-04 MED ORDER — MORPHINE SULFATE ER 30 MG PO TBCR
30.0000 mg | EXTENDED_RELEASE_TABLET | Freq: Two times a day (BID) | ORAL | Status: DC
  Administered 2016-09-04 – 2016-09-05 (×2): 30 mg via ORAL
  Filled 2016-09-04 (×2): qty 1

## 2016-09-04 MED ORDER — NALOXEGOL OXALATE 25 MG PO TABS
25.0000 mg | ORAL_TABLET | Freq: Every day | ORAL | Status: DC
Start: 2016-09-04 — End: 2016-09-05
  Administered 2016-09-04 – 2016-09-05 (×2): 25 mg via ORAL
  Filled 2016-09-04 (×2): qty 1

## 2016-09-04 MED ORDER — MAGNESIUM CITRATE PO SOLN
1.0000 | Freq: Once | ORAL | Status: AC
  Administered 2016-09-04: 1 via ORAL
  Filled 2016-09-04: qty 296

## 2016-09-04 MED ORDER — PROMETHAZINE HCL 25 MG/ML IJ SOLN: 6.2500 mg | INTRAMUSCULAR | Status: DC | PRN

## 2016-09-04 MED ORDER — OXYCODONE HCL 5 MG PO TABS
15.0000 mg | ORAL_TABLET | ORAL | Status: DC | PRN
  Administered 2016-09-04 – 2016-09-05 (×5): 15 mg via ORAL
  Filled 2016-09-04 (×5): qty 3

## 2016-09-04 MED ORDER — ACETAMINOPHEN 325 MG PO TABS
650.0000 mg | ORAL_TABLET | Freq: Three times a day (TID) | ORAL | Status: DC | PRN
Start: 2016-09-04 — End: 2016-09-05
  Administered 2016-09-04: 650 mg via ORAL
  Filled 2016-09-04: qty 2

## 2016-09-04 MED ORDER — OXYCODONE HCL 5 MG PO TABS: 15.0000 mg | ORAL_TABLET | Freq: Four times a day (QID) | ORAL | Status: DC | PRN

## 2016-09-04 MED ORDER — HYDROMORPHONE HCL 1 MG/ML IJ SOLN: 0.2500 mg | INTRAMUSCULAR | Status: DC | PRN

## 2016-09-04 MED ORDER — HYDROMORPHONE HCL 1 MG/ML IJ SOLN
0.2500 mg | INTRAMUSCULAR | Status: DC | PRN
  Administered 2016-09-04 – 2016-09-05 (×2): 0.5 mg via INTRAVENOUS
  Filled 2016-09-04 (×2): qty 1

## 2016-09-04 NOTE — Progress Notes (Signed)
PT Cancellation Note  Patient Details Name: Dylan Hoover MRN: [id number] DOB: [birth year]   Cancelled Treatment:    Reason Eval/Treat Not Completed: Patient declined, no reason specified.  Pt appears miserable, reports extreme pain and nausea. 09/04/2016  St. Clement BingKen Mishel Sans, PT [phone]  (pager)   [name], Eliseo GumKenneth V 09/04/2016, 10:51 AM

## 2016-09-04 NOTE — Care Management Obs Status (Signed)
MEDICARE OBSERVATION STATUS NOTIFICATION   Patient Details  Name: Dylan Hoover S Habermann MRN: [id number] Date of Birth: Mar 26, 1946   Medicare Observation Status Notification Given:  Yes    Kermit BaloKelli F Balinda Heacock, RN 09/04/2016, 3:55 PM

## 2016-09-04 NOTE — Progress Notes (Signed)
    Subjective: 1 Day Post-Op Procedure(s) (LRB): LUMBAR SPINAL CORD STIMULATOR PLACEMENT (N/A) Patient reports pain as 10 on 0-10 scale.  Incisional pain Denies CP or SOB.  Voiding without difficulty. Positive flatus. Pt is nauseated. Pt feels like he needs to have a bowel movement Objective: Vital signs in last 24 hours: Temp:  [97.6 F (36.4 C)-98.7 F (37.1 C)] 98.4 F (36.9 C) (10/19 0530) Pulse Rate:  [59-75] 75 (10/19 0530) Resp:  [11-21] 20 (10/19 0530) BP: (139-161)/(55-99) 142/55 (10/19 0530) SpO2:  [97 %-100 %] 100 % (10/19 0530) Weight:  [74.8 kg (165 lb)-75.6 kg (166 lb 11.2 oz)] 75.6 kg (166 lb 11.2 oz) (10/18 2126)  Intake/Output from previous day: 10/18 0701 - 10/19 0700 In: 1810.6 [I.V.:1760.6; IV Piggyback:50] Out: 895 [Urine:850; Blood:45] Intake/Output this shift: No intake/output data recorded.  Labs: No results for input(s): HGB in the last 72 hours. No results for input(s): WBC, RBC, HCT, PLT in the last 72 hours. No results for input(s): NA, K, CL, CO2, BUN, CREATININE, GLUCOSE, CALCIUM in the last 72 hours. No results for input(s): LABPT, INR in the last 72 hours.  Physical Exam: Neurologically intact ABD soft Neurovascular intact Sensation intact distally Dorsiflexion/Plantar flexion intact Incision: no drainage Compartment soft  Assessment/Plan: 1 Day Post-Op Procedure(s) (LRB): LUMBAR SPINAL CORD STIMULATOR PLACEMENT (N/A) Advance diet Up with therapy Started Mag Citrate Continue Pain management   Teanna Elem, Baxter Kailarmen Christina for Dr. Venita [facility]ickahari [facility] [phone] 09/04/2016, 7:19 AM    Patient ID: Dylan Hoover, male   DOB: 1946-11-03, 70 y.o.   MRN: [id number]

## 2016-09-04 NOTE — Progress Notes (Signed)
Patient ID: Dylan Hoover, male   DOB: [birth year], 70 y.o.   MRN: [id number]  Consulted with the pts pain management provider.  He advised we increase his pain medication to MSContin 30 q12 and Oxy 15-30 q4.  Advised the nurse of this change. Anette Riedelarmen Altamese Deguire, PA-C

## 2016-09-04 NOTE — Op Note (Signed)
NAMEMarland Kitchen  Dylan Hoover, Dylan Hoover NO.:  [id number]  MEDICAL RECORD NO.:  [id number]  LOCATION:  5C19C                        FACILITY:  [facility]  PHYSICIAN:  Natsumi Whitsitt D. Shon Baton, M.D. DATE OF BIRTH:  05/13/1946  DATE OF PROCEDURE:  09/03/2016 DATE OF DISCHARGE:                              OPERATIVE REPORT   PREOPERATIVE DIAGNOSES: 1. Chronic pain syndrome. 2. Failed back syndrome, status post 6 previous lumbar surgeries.  POSTOPERATIVE DIAGNOSES: 1. Chronic pain syndrome. 2. Failed back syndrome, status post 6 previous lumbar surgeries.  OPERATIVE PROCEDURE:  Implantation of spinal cord stimulator.  FIRST ASSIST:  Anette Riedel.  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  The patient has been under pain medical management and recently had a spinal cord stimulator with positive results.  After discussing treatment options, we elected to proceed with permanent implantation.  INTRAOPERATIVE FINDINGS:  Attempted T9 laminotomy.  After multiple passes, I was unable to pass the leads across the T8 level.  I then did a T8 laminotomy and was still unable to pass it.  I, therefore, placed the [facility] [employer] spinal cord stimulator behind the vertebral body of T9 one level below where the trial was performed.  OPERATIVE NOTE:  The patient was brought to the operating room and placed supine on the operating table.  After successful induction of general anesthesia and endotracheal intubation, TEDs, SCDs were applied. He was turned prone onto the Wilson frame and all bony prominences were well padded, and the back was prepped and draped in a standard fashion. A time-out was taken confirming the patient, procedure, and all other pertinent important data.  Once this was completed, I identified the T12 vertebral body based on the rib  and counted up and identified the T10-11 and the T9-10 disk spaces.  I infiltrated an incision site midline and then made a skin incision.  Sharp dissection was carried out down to the deep fascia.  Using Bovie and Cobb, I stripped the paraspinal muscles to expose the inferior third of the T9 spinous process and lamina and all of the T10 spinous process and lamina.  Self-retaining retractor was placed, and an x-ray was taken to confirm the appropriate level.  Once this was done, a double-action Leksell rongeur was used to remove the bulk of the T9 spinous process.  I then used a 2 mm Kerrison to perform a laminotomy of T9.  I then used the [address] #4 to dissect through the central raphe, the ligamentum flavum, and resected the ligamentum flavum exposing the dorsal surface of the thecal sac.  After this, I attempted to pass the trial device and it was freely able to pass right up into the T8-9 disk space.  I attempted to pass multiple times, but I was unsuccessful.  I was quite concerned that there was an obstruction, and so I extended my incision cephalad and dissected out to T8-9 spinous process.  I removed the T8 spinous process and then performed a laminotomy of T8 and then resected the ligamentum flavum.  In  a similar fashion, I performed before at the T9 level.  I then attempted to pass a [facility] elevator underneath the lamina of T8, but there was almost a bony obstruction I could not get past.  At this point, I elected despite multiple attempts to just place the [employer] paddle behind at the T9 level.  I did not want to risk a spinal cord injury or durotomy. At this point, I secured the leads directly to the T10 spinous process with FiberWire and then made a second incision in the left gluteal region to place the battery.  I then used the submuscular passer to pass the wires to the second incision site and then secured it to the battery.  The battery was tested and noted  the leads were functioning without problems.  I then irrigated my both wounds copiously with normal saline and made sure I had hemostasis using bipolar electrocautery and FloSeal.  The excess wire was wrapped underneath the battery and was packed into the battery site, and this was sutured down to the deep fascia with 2 interrupted #1 Vicryl sutures.  I closed the deep fascia of both wounds after copious irrigation and confirming hemostasis with a #1 Vicryl suture, then the superficial layer with 2-0 Vicryl sutures, and then a 3-0 Monocryl for the skin.  Steri-Strips, dry dressing were applied, and the patient was ultimately extubated and transferred to the PACU without incident.  At the end of the case, all needle and sponge counts were correct.     Eugenio Dollins D. Shon BatonBrooks, M.D.     DDB/MEDQ  D:  09/03/2016  T:  09/04/2016  Job:  [id number]

## 2016-09-04 NOTE — Clinical Social Work Note (Deleted)
CSW talked with patient regarding his housing situation and patient explained [place]. He also reported that he [place] lived [place] mother [place] well as staying in [place] for a period of time. Dylan [place] [facility] but it was full. His plan is to return to stay with his mother and he will assist her with the lights, cable and food. Patient receives disability and responded when asked that he has some money left, however did not say how much. CSW consulted with clinical social work Chiropodistassistant director, Wandra MannanZack Brooks and was advised that we will be unable to assist patient as he has been assisted twice before and did not use the transportation assistance that was provided to him. Patient informed of this and was also advised that his MD will be contacted.  Patient's doctor contacted and updated. Nursing provided with bus pass for patient.    Dylan Hoover, MSW, LCSW Licensed Clinical Social Worker Clinical Social Work Department [facility] [phone]

## 2016-09-04 NOTE — Progress Notes (Signed)
OT Cancellation Note  Patient Details Name: Dylan Hoover MRN: [id number] DOB: May 23, 1946   Cancelled Treatment:    Reason Eval/Treat Not Completed: Pain limiting ability to participate;Other (comment) (nausea). Pt reporting pain and nausea, "I can't get out of bed right now, not with the way I'm feeling." Will reattempt OT eval as able.   Raynald KempKathryn Myron Lona OTR/L Pager: [phone]   09/04/2016, 12:07 PM

## 2016-09-04 NOTE — Progress Notes (Signed)
PT paged and notified of pt discharge is pending on PT eval; Ken (PT) said PT tried to work with pt x2 but couldn't so they will come back tomorrow to evaluate him. Dionne BucyP. Amo Lysa Livengood RN

## 2016-09-04 NOTE — Care Management Note (Signed)
Case Management Note  Patient Details  Name: Dylan Hoover MRN: [id number] Date of Birth: 04/10/1946  Subjective/Objective:   Pt underwent: LUMBAR SPINAL CORD STIMULATOR PLACEMENT. He is from home with his spouse.                 Action/Plan: Awaiting PT/OT recommendations. CM following for discharge needs.   Expected Discharge Date:                  Expected Discharge Plan:     In-House Referral:     Discharge planning Services     Post Acute Care Choice:    Choice offered to:     DME Arranged:    DME Agency:     HH Arranged:    HH Agency:     Status of Service:  In process, will continue to follow  If discussed at Long Length of Stay Meetings, dates discussed:    Additional Comments:  Kermit BaloKelli F Delesha Pohlman, RN 09/04/2016, 10:28 AM

## 2016-09-04 NOTE — Progress Notes (Addendum)
Patient ambulate to bathroom and back to bed with stand by assistance no brace or walker, is having a lot of pain with activity. Patient is extremely nice considering the amount of pain he is enduring. He has urinated twice over night with total over 800 ml.

## 2016-09-04 NOTE — Progress Notes (Signed)
Patient having head ache called Dr Shon BatonBrooks office for Med. Order for tylenol.

## 2016-09-04 NOTE — Clinical Social Work Note (Signed)
CSW received SNF consult for patient and visit made to room to talk with patient. CSW talked with patient and his wife Dylan Hoover regarding consult for SNF placement for ST rehab. Patient immediately and adamantly informed CSW that he was not going to a facility and would be going home and have rehab at home or outpatient rehab as he has done before. Mr. Dylan Hoover said indicated that he was familiar with the facilities on the list [facility] and they are not good. Patient's wife expressed to patient her concern about her not being able to assist patient at home and Mr. Dylan Hoover indicated that he would hire help if need be. CSW listened and expressed understanding with patient and his choice and explained that the nurse case manager would talk with he and his [facility].   The nurse case manager entered room prior to CSW's departure and began conversation with patient/wife about home health services. CSW signing off as [facility].  Genelle BalVanessa Amirra Herling, MSW, LCSW Licensed Clinical Social Worker Clinical Social Work Department [facility] [phone]

## 2016-09-05 ENCOUNTER — Encounter (HOSPITAL_COMMUNITY): Payer: Self-pay | Admitting: General Practice

## 2016-09-05 DIAGNOSIS — Z950 Presence of cardiac pacemaker: Secondary | ICD-10-CM | POA: Diagnosis not present

## 2016-09-05 DIAGNOSIS — G8929 Other chronic pain: Secondary | ICD-10-CM | POA: Diagnosis present

## 2016-09-05 DIAGNOSIS — I10 Essential (primary) hypertension: Secondary | ICD-10-CM | POA: Diagnosis not present

## 2016-09-05 DIAGNOSIS — Z7982 Long term (current) use of aspirin: Secondary | ICD-10-CM | POA: Diagnosis not present

## 2016-09-05 DIAGNOSIS — Z87891 Personal history of nicotine dependence: Secondary | ICD-10-CM | POA: Diagnosis not present

## 2016-09-05 DIAGNOSIS — K219 Gastro-esophageal reflux disease without esophagitis: Secondary | ICD-10-CM | POA: Diagnosis not present

## 2016-09-05 DIAGNOSIS — M40209 Unspecified kyphosis, site unspecified: Secondary | ICD-10-CM | POA: Diagnosis not present

## 2016-09-05 DIAGNOSIS — G894 Chronic pain syndrome: Secondary | ICD-10-CM | POA: Diagnosis not present

## 2016-09-05 NOTE — Evaluation (Signed)
Occupational Therapy Evaluation and Discharge Patient Details Name: Dylan Hoover S Lascano MRN: [id number] DOB: 1946-10-18 Today's Date: 09/05/2016    History of Present Illness pt is a 70 y/o male with h/o HTN, RLS, and back surgeries x5, admitted with back deformities associated with chronic back pain syndrome.  S/P implantation of a spinal cord stimulator.   Clinical Impression   PTA Pt independent in ADL and used a rollator/SPC for ambulation. Pt currently min A for ADL and min guard for ambulation with RW. Pt able to recall 3/3 back precautions with no vc from therapist. Pt has AE and DME from previous surgeries. Pt with below deficits, and at adequate level for discharge home with wife and 24 hour assistance.     Follow Up Recommendations  Supervision/Assistance - 24 hour;No OT follow up    Equipment Recommendations  None recommended by OT (Pt has all necessary equipment)    Recommendations for Other Services       Precautions / Restrictions Precautions Precautions: Back;Fall Precaution Booklet Issued: No Precaution Comments: Pt able to recall 3/3 precautions from previous surgeries Restrictions Weight Bearing Restrictions: No      Mobility Bed Mobility Overal bed mobility: Needs Assistance Bed Mobility: Rolling Rolling: Min assist         General bed mobility comments: good technique. Hand held assist for raising trunk  Transfers Overall transfer level: Needs assistance Equipment used: Rolling walker (2 wheeled) Transfers: Sit to/from Stand Sit to Stand: Min guard         General transfer comment: cues for best technique    Balance Overall balance assessment: Needs assistance Sitting-balance support: Single extremity supported;No upper extremity supported;Feet supported Sitting balance-Leahy Scale: Fair Sitting balance - Comments: struggle pulling forward out of the recliner, but holds well.   Standing balance support: During functional activity;No upper  extremity supported;Single extremity supported Standing balance-Leahy Scale: Fair Standing balance comment: able to stand at sink for ADL with use of single upper extremity for balance sometimes                            ADL Overall ADL's : Needs assistance/impaired Eating/Feeding: Modified independent;Sitting   Grooming: Wash/dry hands;Oral care;Wash/dry face;Supervision/safety;Standing;Cueing for safety Grooming Details (indicate cue type and reason): reminder of no bending during oral care         Upper Body Dressing : Minimal assistance;Sitting   Lower Body Dressing: Minimal assistance;With caregiver independent assisting;Sit to/from stand Lower Body Dressing Details (indicate cue type and reason): Pt able to cross legs and bring feet to knees for LB dressing Toilet Transfer: Min guard;Ambulation;Regular Toilet;Grab bars;RW StatisticianToilet Transfer Details (indicate cue type and reason): vc for safe hand placement Toileting- Clothing Manipulation and Hygiene: Min guard   Tub/ Shower Transfer: Walk-in shower;Min guard;Ambulation;Rolling walker Tub/Shower Transfer Details (indicate cue type and reason): discussed having wife there for safety at first Functional mobility during ADLs: Min guard;Rolling walker       Vision     Perception     Praxis      Pertinent Vitals/Pain Pain Assessment: 0-10 Pain Score: 6  Pain Location: spine Pain Descriptors / Indicators: Grimacing;Aching;Sore Pain Intervention(s): Premedicated before session;Monitored during session;Repositioned     Hand Dominance Left   Extremity/Trunk Assessment Upper Extremity Assessment Upper Extremity Assessment: Overall WFL for tasks assessed   Lower Extremity Assessment Lower Extremity Assessment: Defer to PT evaluation   Cervical / Trunk Assessment Cervical / Trunk Assessment: Other exceptions (  history of back surgeries)   Communication Communication Communication: No difficulties    Cognition Arousal/Alertness: Awake/alert Behavior During Therapy: WFL for tasks assessed/performed Overall Cognitive Status: Within Functional Limits for tasks assessed                     General Comments       Exercises       Shoulder Instructions      Home Living Family/patient expects to be discharged to:: Private residence Living Arrangements: Spouse/significant other Available Help at Discharge: Family;Available 24 hours/day Type of Home: House Home Access: Stairs to enter [employer] of Steps: 1 Entrance Stairs-Rails: None Home Layout: Multi-level;Able to live on main level with bedroom/bathroom     Bathroom Shower/Tub: Producer, television/film/video: Handicapped height Bathroom Accessibility: Yes How Accessible: Accessible via wheelchair Home Equipment: Shower seat - built in;Grab bars - toilet;Grab bars - tub/shower;Cane - single point;Wheelchair - power;Hand held Careers information officer - 4 wheels          Prior Functioning/Environment Level of Independence: Independent with assistive device(s)        Comments: used a SPC and rollator for mobility, wife did IADL        OT Problem List: Decreased range of motion;Decreased activity tolerance;Impaired balance (sitting and/or standing);Pain   OT Treatment/Interventions:      OT Goals(Current goals can be found in the care plan section) Acute Rehab OT Goals Patient Stated Goal: To get better so he can be more social  OT Goal Formulation: With patient Time For Goal Achievement: 09/12/16 Potential to Achieve Goals: Good  OT Frequency:     Barriers to D/C:            Co-evaluation              End of Session Equipment Utilized During Treatment: Gait belt;Rolling walker Nurse Communication: Mobility status  Activity Tolerance: Patient tolerated treatment well Patient left: in chair;with call bell/phone within reach   Time: 0830-0915 OT Time Calculation (min): 45  min Charges:  OT General Charges $OT Visit: 1 Procedure OT Evaluation $OT Eval Low Complexity: 1 Procedure OT Treatments $Self Care/Home Management : 23-37 mins G-Codes: OT G-codes **NOT FOR INPATIENT CLASS** Functional Assessment Tool Used: Clinical Judgement Functional Limitation: Self care Self Care Current Status (Y7829): At least 40 percent but less than 60 percent impaired, limited or restricted Self Care Goal Status (F6213): At least 1 percent but less than 20 percent impaired, limited or restricted Self Care Discharge Status [phone]): At least 20 percent but less than 40 percent impaired, limited or restricted  Emelda Fear 09/05/2016, 11:14 AM  Sherryl Manges OTR/L [phone]

## 2016-09-05 NOTE — Progress Notes (Signed)
Pt discharged at this time taking all personal belongings. Surgical dressing site clean, dry and intact. IV discontinued, dry dressing applied. Discharge instructions provided with verbal understanding. No noted distress.

## 2016-09-05 NOTE — Progress Notes (Addendum)
Physical Therapy Treatment Patient Details Name: Dylan Hoover S Yankey MRN: [id number] DOB: [birth year] Today's Date: 09/05/2016    History of Present Illness pt is a 70 y/o male with h/o HTN, RLS, and back surgeries x5, admitted with back deformities associated with chronic back pain syndrome.  S/P implantation of a spinal cord stimulator.    PT Comments    Pt moving as well as can be expected.  Painful mobility, mildly weak gait, but progressing well.  Stairs completed.  All education reviewed.  Will sign off   Follow Up Recommendations  No PT follow up;Supervision for mobility/OOB     Equipment Recommendations  None recommended by PT    Recommendations for Other Services       Precautions / Restrictions Precautions Precautions: Back;Fall    Mobility  Bed Mobility               General bed mobility comments: not tested  Transfers Overall transfer level: Needs assistance   Transfers: Sit to/from Stand Sit to Stand: Min guard         General transfer comment: cues for best technique  Ambulation/Gait Ambulation/Gait assistance: Supervision Ambulation Distance (Feet): 300 Feet Assistive device: Rolling walker (2 wheeled) Gait Pattern/deviations: Step-through pattern Gait velocity: slower Gait velocity interpretation: Below normal speed for age/gender General Gait Details: gait described as mildly weak-kneed, equal step length with flexed posture   Stairs Stairs: Yes   Stair Management: One rail Right;Step to pattern;Forwards Number of Stairs: 2 General stair comments: effortful, but safe with rail and wife's supervision  Wheelchair Mobility    Modified Rankin (Stroke Patients Only)       Balance Overall balance assessment: Needs assistance Sitting-balance support: Single extremity supported;No upper extremity supported Sitting balance-Leahy Scale: Fair Sitting balance - Comments: struggle pulling forward out of the recliner, but holds well.    Standing balance support: Bilateral upper extremity supported Standing balance-Leahy Scale: Poor Standing balance comment: reliant on the RW.                    Cognition Arousal/Alertness: Awake/alert Behavior During Therapy: WFL for tasks assessed/performed Overall Cognitive Status: Within Functional Limits for tasks assessed                      Exercises      General Comments General comments (skin integrity, edema, etc.): Reiviewed back education.      Pertinent Vitals/Pain Pain Assessment: 0-10 Pain Location: spine Pain Descriptors / Indicators: Aching;Sore    Home Living Family/patient expects to be discharged to:: Private residence Living Arrangements: Spouse/significant other Available Help at Discharge: Family;Available 24 hours/day Type of Home: House Home Access: Stairs to enter Entrance Stairs-Rails: None Home Layout: Multi-level;Able to live on main level with bedroom/bathroom Home Equipment: Shower seat - built in;Grab bars - toilet;Grab bars - tub/shower;Cane - single point;Wheelchair - power;Hand held Careers information officershower head;Walker - 4 wheels      Prior Function Level of Independence: Independent with assistive device(s)      Comments: used a SPC and rollator for mobility, wife did IADL   PT Goals (current goals can now be found in the care plan section) Acute Rehab PT Goals PT Goal Formulation: All assessment and education complete, DC therapy    Frequency           PT Plan      Co-evaluation             End of Session   Activity  Tolerance: Patient tolerated treatment well Patient left: in chair;with call bell/phone within reach     Time: 0935-1015 PT Time Calculation (min) (ACUTE ONLY): 40 min  Charges:  $Gait Training: 8-22 mins $Therapeutic Activity: 8-22 mins                    G Codes:      Devina Bezold, Eliseo Gum 09/05/2016, 10:43 AM  09/05/2016  [place] Bing, PT [phone]  (pager)

## 2016-09-05 NOTE — Care Management Note (Signed)
Case Management Note  Patient Details  Name: Dylan Hoover MRN: [id number] Date of Birth: 06/05/1946  Subjective/Objective:                    Action/Plan: Pt discharging home with his wife. No f/u per PT/OT or DME needs. No further needs per CM.  Expected Discharge Date:                  Expected Discharge Plan:  Home/Self Care  In-House Referral:     Discharge planning Services     Post Acute Care Choice:    Choice offered to:     DME Arranged:    DME Agency:     HH Arranged:    HH Agency:     Status of Service:  Completed, signed off  If discussed at [employer] Length of Stay Meetings, dates discussed:    Additional Comments:  Kermit BaloKelli F Lateria Alderman, RN 09/05/2016, 2:52 PM

## 2016-09-05 NOTE — Progress Notes (Signed)
    Subjective: Procedure(s) (LRB): LUMBAR SPINAL CORD STIMULATOR PLACEMENT (N/A) 2 Days Post-Op  Patient reports pain as 4 on 0-10 scale.  Reports decreased leg pain reports incisional back pain   Positive void Positive bowel movement Positive flatus Negative chest pain or shortness of breath  Objective: Vital signs in last 24 hours: Temp:  [98.2 F (36.8 C)-99.1 F (37.3 C)] 98.2 F (36.8 C) (10/20 0527) Pulse Rate:  [59-70] 59 (10/20 0527) Resp:  [18-20] 18 (10/20 0527) BP: (121-156)/(45-66) 121/60 (10/20 0527) SpO2:  [92 %-100 %] 97 % (10/20 0527)  Intake/Output from previous day: 10/19 0701 - 10/20 0700 In: 243 [P.O.:240; I.V.:3] Out: 1050 [Urine:1050]  Labs: No results for input(s): WBC, RBC, HCT, PLT in the last 72 hours. No results for input(s): NA, K, CL, CO2, BUN, CREATININE, GLUCOSE, CALCIUM in the last 72 hours. No results for input(s): LABPT, INR in the last 72 hours.  Physical Exam: Neurologically intact ABD soft Incision: dressing C/D/I Compartment soft  Assessment/Plan: Patient stable  xrays n/a Continue mobilization with physical therapy Continue care  Up with therapy  D/c to home today  Venita Lickahari Pristine Gladhill, MD [facility] [phone]

## 2016-09-09 ENCOUNTER — Encounter (HOSPITAL_COMMUNITY): Payer: Self-pay | Admitting: Orthopedic Surgery

## 2016-09-09 NOTE — Progress Notes (Signed)
Late Entry Addendum for Initial Evaluation    09/05/16 1021  PT Time Calculation  PT Start Time (ACUTE ONLY) 0935  PT Stop Time (ACUTE ONLY) 1015  PT Time Calculation (min) (ACUTE ONLY) 40 min  PT G-Codes **NOT FOR INPATIENT CLASS**  Functional Assessment Tool Used clinical judgement  Functional Limitation Mobility: Walking and moving around  Mobility: Walking and Moving Around Current Status (J1914(G8978) CI  Mobility: Walking and Moving Around Goal Status (N8295(G8979) CI  Mobility: Walking and Moving Around Discharge Status (A2130(G8980) CI  PT General Charges  $$ ACUTE PT VISIT 1 Procedure  PT Evaluation  $PT Eval Low Complexity 1 Procedure  PT Treatments  $Gait Training 8-22 mins  $Therapeutic Activity 8-22 mins   09/09/2016  [place] BingKen Phuong Hillary, PT [phone]  (pager)

## 2016-09-09 NOTE — OR Nursing (Signed)
LATE ENTRY: Added implant omitted from intraoperative record; Spectra IPG SN [id number].  Confirmed with RN circulator and vendor label documentation scanned to media.

## 2016-09-24 NOTE — Discharge Summary (Signed)
Physician Discharge Summary  Patient ID: Dylan Hoover MRN: [id number] DOB/AGE: [birth year] 70 y.o.  Admit date: 09/03/2016 Discharge date: 09/24/2016  Admission Diagnoses:  <principal problem not specified>  Discharge Diagnoses:  Active Problems:   Back pain   Past Medical History:  Diagnosis Date  . Anemia   . Arthritis   . BPH (benign prostatic hyperplasia)   . Chronic kidney disease    stage 111  . Depression   . ED (erectile dysfunction)   . GERD (gastroesophageal reflux disease)   . Hypertension   . Presence of permanent cardiac pacemaker    medtronic  . RLS (restless legs syndrome)     Surgeries: Procedure(s): LUMBAR SPINAL CORD STIMULATOR PLACEMENT on 09/03/2016   Consultants (if any):   Discharged Condition: Improved  Hospital Course: Dylan Hoover is an 70 y.o. male who was admitted 09/03/2016 with a diagnosis of Chronic pain syndrome and went to the operating room on 09/03/2016 and underwent the above named procedures.  Post op day one pt reports a high level of pain.  The pt is nauseated and constipated.  Started mag citrate for pt and pt worked with PT. Consulted with his pain management doctor and increased his pain medication. Post op day 2 pt reports pain well controlled.  Pt is urinating and had BM.  Pt discharged home after cleared by PT.  He was given perioperative antibiotics:  Anti-infectives    Start     Dose/Rate Route Frequency Ordered Stop   09/03/16 2300  ceFAZolin (ANCEF) IVPB 1 g/50 mL premix     1 g 100 mL/hr over 30 Minutes Intravenous Every 8 hours 09/03/16 2127 09/04/16 0620   09/03/16 1230  ceFAZolin (ANCEF) IVPB 2g/100 mL premix     2 g 200 mL/hr over 30 Minutes Intravenous 30 min pre-op 09/03/16 1230 09/03/16 1748    .  He was given sequential compression devices, early ambulation, and TED for DVT prophylaxis.  He benefited maximally from the hospital stay and there were no complications.    Recent vital signs:  Vitals:   09/05/16 0527 09/05/16 0938  BP: 121/60 135/60  Pulse: (!) 59 74  Resp: 18 20  Temp: 98.2 F (36.8 C) 98.3 F (36.8 C)    Recent laboratory studies:  No results found for: HGB No results found for: WBC, PLT No results found for: INR No results found for: NA, K, CL, CO2, BUN, CREATININE, GLUCOSE  Discharge Medications:     Medication List    TAKE these medications   aspirin EC 81 MG tablet Take 81 mg by mouth daily.   clobetasol cream 0.05 % Commonly known as:  TEMOVATE Apply 1 application topically 3 (three) times daily.   diazepam 10 MG tablet Commonly known as:  VALIUM Take 10 mg by mouth 3 (three) times daily as needed. For anxiety.   diclofenac sodium 1 % Gel Commonly known as:  VOLTAREN Apply 1 application topically 4 (four) times daily.   docusate sodium 100 MG capsule Commonly known as:  COLACE Take 100 mg by mouth 2 (two) times daily.   DULoxetine 60 MG capsule Commonly known as:  CYMBALTA Take 60 mg by mouth daily.   EMBEDA 20-0.8 MG Cpcr Generic drug:  Morphine-Naltrexone Take 1 capsule by mouth every 12 (twelve) hours.   fenofibrate 160 MG tablet Take 160 mg by mouth daily.   ferrous sulfate 325 (65 FE) MG tablet Take 325 mg by mouth 2 (two) times daily.   gabapentin  300 MG capsule Commonly known as:  NEURONTIN Take 300 mg by mouth 2 (two) times daily.   losartan 50 MG tablet Commonly known as:  COZAAR Take 50 mg by mouth daily.   MOVANTIK 25 MG Tabs tablet Generic drug:  naloxegol oxalate Take 25 mg by mouth daily as needed. For constipation.   multivitamin with minerals Tabs tablet Take 1 tablet by mouth daily.   NARCAN 4 MG/0.1ML Liqd Generic drug:  naloxone HCl Place 4 mg into the nose as directed. Spray into one nostril as directed for suspected opoid overdose. May repeat in 2-3 minutes later in the other nostril.   oxyCODONE 15 MG immediate release tablet Commonly known as:  ROXICODONE Take 15-30 mg by mouth 3 (three) times  daily as needed. For pain.   pantoprazole 40 MG tablet Commonly known as:  PROTONIX Take 40 mg by mouth daily.   research study medication Take 1 each by mouth every evening. Vitamin D 200 units daily or placebo   rOPINIRole 3 MG tablet Commonly known as:  REQUIP Take 3 mg by mouth 2 (two) times daily. Noon and bedtime.   tamsulosin 0.4 MG Caps capsule Commonly known as:  FLOMAX Take 0.4 mg by mouth at bedtime.       Diagnostic Studies: Dg Thoracolumabar Spine  Result Date: 09/04/2016 CLINICAL DATA:  Spinal stimulator placement.  Initial encounter. EXAM: THORACOLUMBAR SPINE - 2 VIEW COMPARISON:  None. FINDINGS: Two fluoroscopic C-arm images are provided from the OR, demonstrating spinal stimulator leads overlying the lower thoracic spine. The visualized osseous structures are grossly unremarkable. IMPRESSION: Spinal stimulator lead placement overlying the lower thoracic spine. Electronically Signed   By: Roanna RaiderJeffery  Chang M.D.   On: 09/04/2016 00:46   Dg C-arm 61-120 Min  Result Date: 09/04/2016 CLINICAL DATA:  Spinal stimulator placement.  Initial encounter. EXAM: THORACOLUMBAR SPINE - 2 VIEW COMPARISON:  None. FINDINGS: Two fluoroscopic C-arm images are provided from the OR, demonstrating spinal stimulator leads overlying the lower thoracic spine. The visualized osseous structures are grossly unremarkable. IMPRESSION: Spinal stimulator lead placement overlying the lower thoracic spine. Electronically Signed   By: Roanna RaiderJeffery  Chang M.D.   On: 09/04/2016 00:46    Disposition: 01-Home or Self Care Pt provided post op meds Pt will present to clinic in 2 weeks      Signed: Kirt BoysMayo, Carmen Christina 09/24/2016, 11:05 AM

## 2016-11-19 NOTE — Progress Notes (Signed)
PT evaluation addendum Late entry for 09/05/16 Visit diagnosis   09/05/16 1021  PT Assessment  PT Recommendation/Assessment Patient needs continued PT services  PT Problem List Decreased strength;Decreased activity tolerance;Decreased balance;Decreased mobility;Pain (visit diagnosis:  muscle weakness, generalized)  11/19/2016 Corlis HoveMargie Noeli Lavery, PT [phone]

## 2016-12-09 NOTE — Progress Notes (Signed)
PT evaluation addendum Late entry for 09/05/16 Visit diagnosis:    09/05/16 1021  PT Assessment  PT Recommendation/Assessment Patient needs continued PT services  PT Problem List Decreased strength;Decreased activity tolerance;Decreased balance;Decreased mobility;Pain (visit diagnosis:  other abnormalities of gait and mobility)  12/09/2016 Corlis HoveMargie Surena Welge, PT [phone]

## 2016-12-10 NOTE — Progress Notes (Signed)
OT Note - Addendum    09/05/16 1130  OT Assessment  OT Problem List Other (comment) (other abnormalities of gait)  [facility] Hannie Shoe, OT/L  [phone] 09/05/2017

## 2018-02-05 IMAGING — XA DG MYELOGRAPHY LUMBAR INJ LUMBOSACRAL
12 of 21 series · 12 of 21 positions shown · non-contrast
Comparison: Noncontrast lumbar spine CT 08/04/2016

CLINICAL DATA: Lumbar post-laminectomy syndrome. Low back pain.
Anterior right thigh pain. Prior lumbar surgeries.
TECHNIQUE: Contiguous axial images were obtained through the Lumbar spine after
the intrathecal infusion of infusion. Coronal and sagittal
reconstructions were obtained of the axial image sets.

[Series 1: vasc adipose · 1 of 1 slices shown (1 of 11)]
[im 1/1]
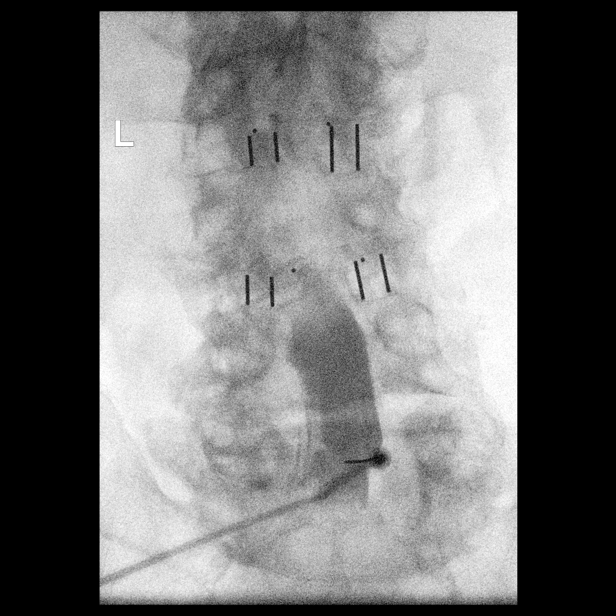

[Series 2: vasc adipose · 1 of 1 slices shown (2 of 11)]
[im 1/1]
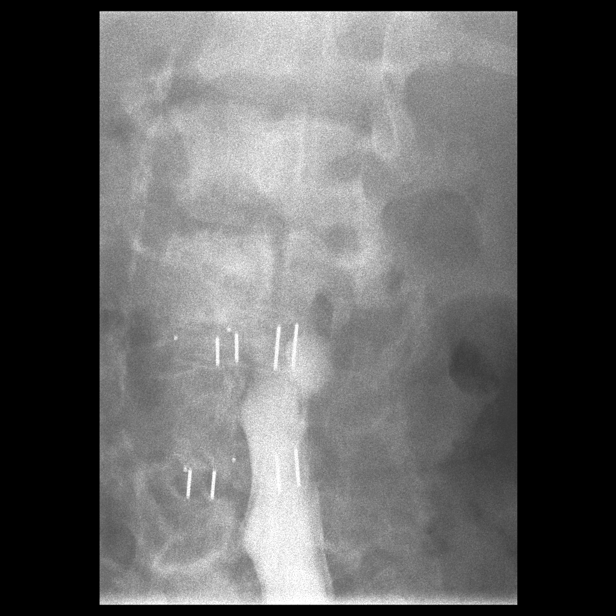

[Series 3: w lumbar spine extension · 0.15mm/px · 1 of 1 slices shown]
[im 1/1]
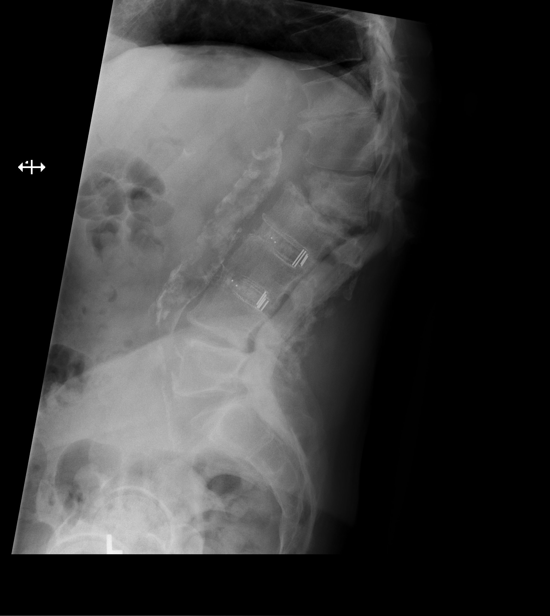

[Series 4: vasc adipose · 1 of 1 slices shown (3 of 11)]
[im 1/1]
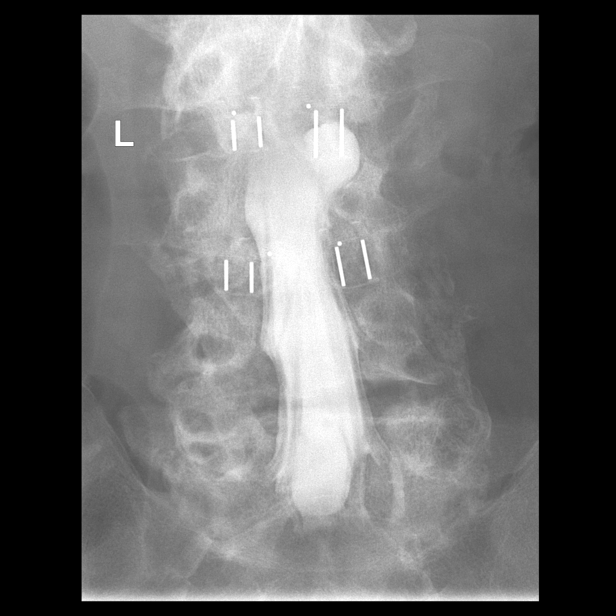

[Series 5: vasc adipose · 1 of 1 slices shown (4 of 11)]
[im 1/1]
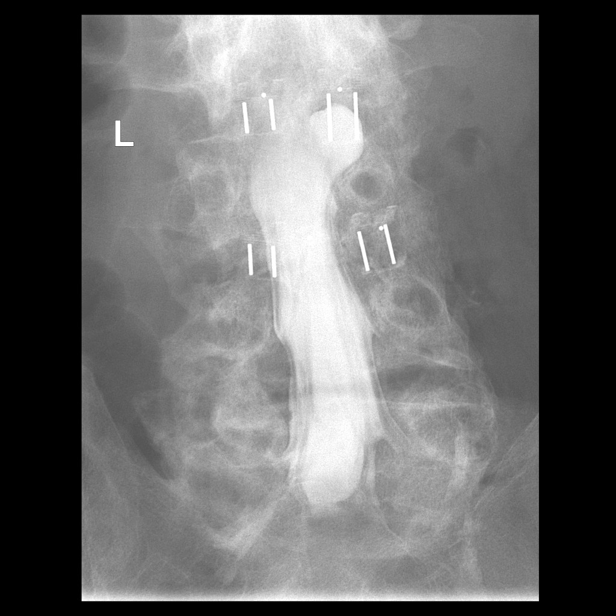

[Series 7: vasc adipose · 1 of 1 slices shown (5 of 11)]
[im 1/1]
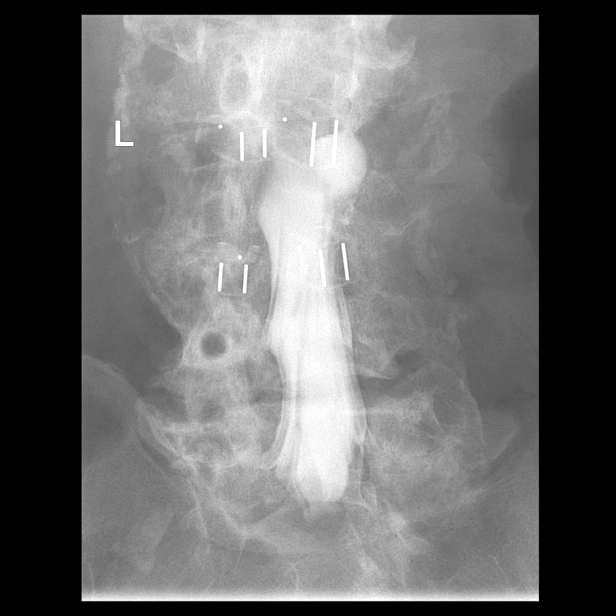

[Series 9: vasc adipose · 1 of 1 slices shown (6 of 11)]
[im 1/1]
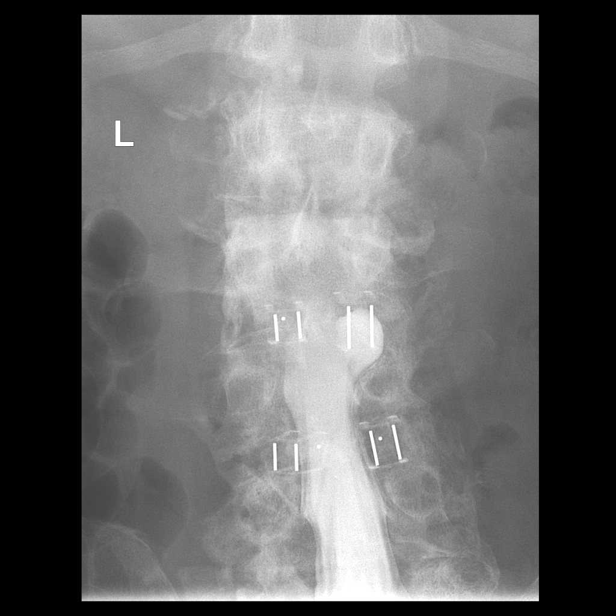

[Series 11: vasc adipose · 1 of 1 slices shown (7 of 11)]
[im 1/1]
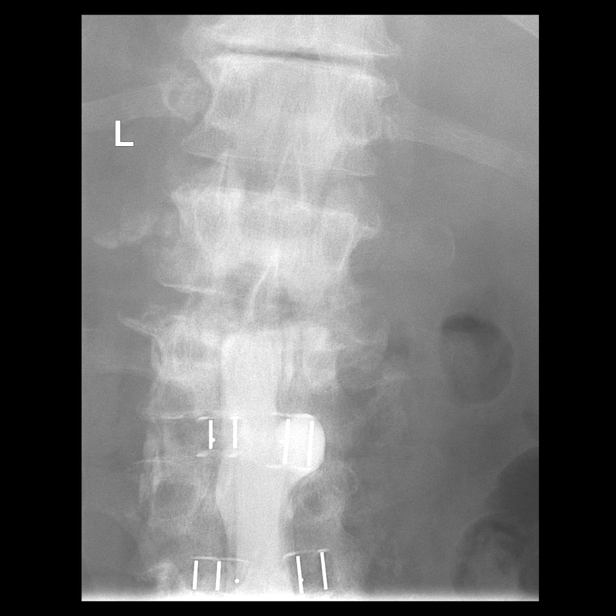

[Series 12: vasc adipose · 1 of 1 slices shown (8 of 11)]
[im 1/1]
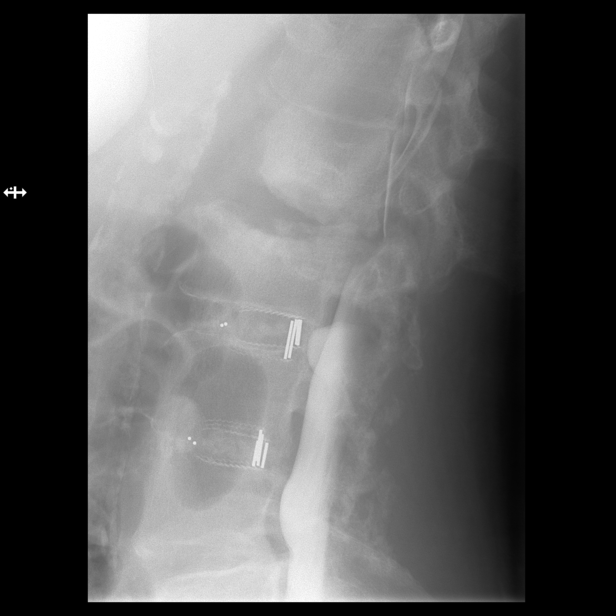

[Series 14: vasc adipose · 1 of 1 slices shown (9 of 11)]
[im 1/1]
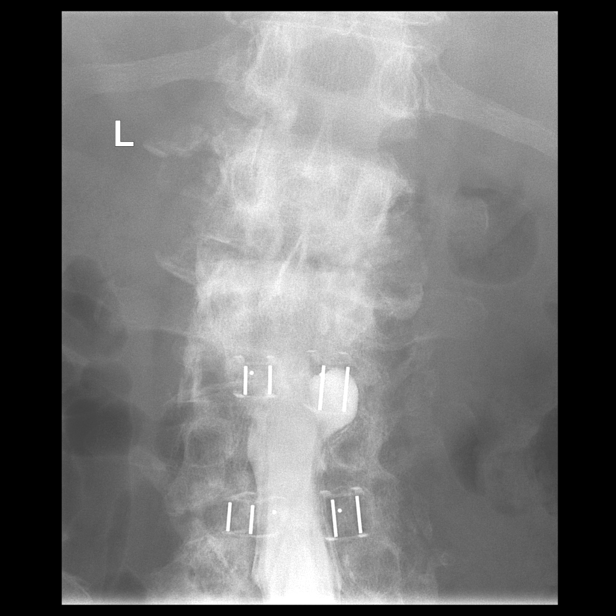

[Series 16: vasc adipose · 1 of 1 slices shown (10 of 11)]
[im 1/1]
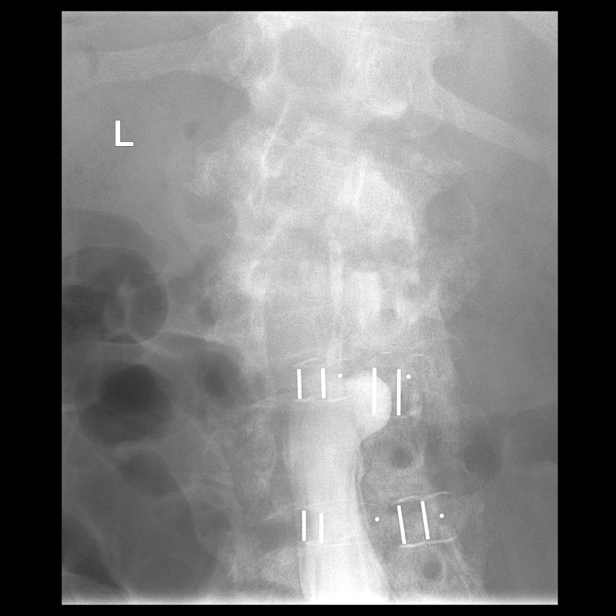

[Series 18: vasc adipose · 1 of 1 slices shown (11 of 11)]
[im 1/1]
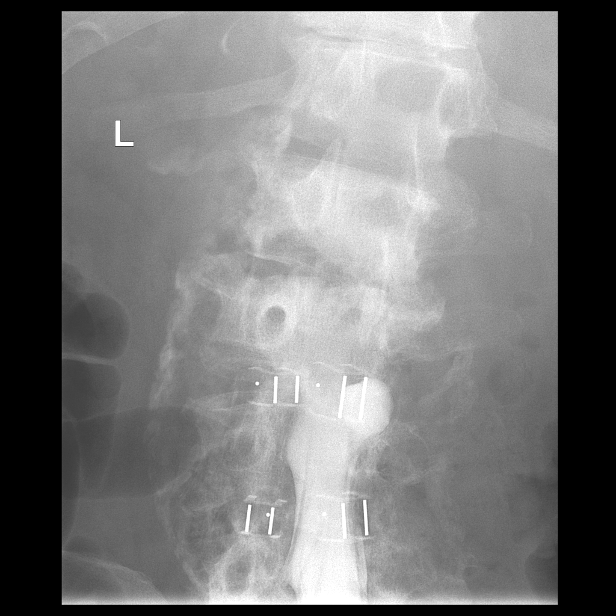

[12 of 21 positions shown; findings below may reference images not displayed]

EXAM:
LUMBAR MYELOGRAM

FLUOROSCOPY TIME:  Radiation Exposure Index (as provided by the
fluoroscopic device): 224.98 microGray*m^2

Fluoroscopy Time (in minutes and seconds):  24 seconds

PROCEDURE:
After thorough discussion of risks and benefits of the procedure
including bleeding, infection, injury to nerves, blood vessels,
adjacent structures as well as headache and CSF leak, written and
oral informed consent was obtained. Consent was obtained by Dr.
Isayeva Zakaraia. Time out form was completed.

Patient was positioned prone on the fluoroscopy table. Local
anesthesia was provided with 1% lidocaine without epinephrine after
prepped and draped in the usual sterile fashion. Puncture was
performed at L5 using a 3 1/2 inch 22-gauge spinal needle via a
midline approach. Using a single pass through the dura, the needle
was placed within the thecal sac, with return of clear CSF. 15 mL of
Isovue M 200 was injected into the thecal sac, with normal
opacification of the nerve roots and cauda equina consistent with
free flow within the subarachnoid space.

I personally performed the lumbar puncture and administered the
intrathecal contrast. I also personally supervised acquisition of
the myelogram images.
FINDINGS: LUMBAR MYELOGRAM FINDINGS:

Advanced disc degeneration is again seen at L1-2 with sclerotic and
erosive endplate changes. Grade 1 retrolisthesis of L1 on L2
measures approximately 10 mm. There is focal kyphosis at this level
which increases with flexion, although the degree of listhesis does
not significantly change. There is slight anterolisthesis of L4 on
L5 and L5 on S1, unchanged with flexion or extension. The spinal
canal appears widely patent from L2-3 to L5-S1 following posterior
decompression at these levels. Interbody fusion at L2-3 and L3-4.
Only a small amount of contrast spread superior to the L1-2 disc
space level during the initial myelographic portion of the study,
however this region was much better opacified on subsequent upright
radiographs with the spinal canal appearing adequately patent at
this level. There is mild lumbar levoscoliosis. Aortic
atherosclerosis is noted.

CT LUMBAR MYELOGRAM FINDINGS:

There is mild lumbar levoscoliosis. Minimal anterolisthesis of L4 on
L5 and L5 on S1 is unchanged. Retrolisthesis of L1 on L2 measures 10
mm, unchanged with marked sclerotic and erosive endplate changes
again noted at this level. Old pedicle screw tracks are noted
bilaterally at from L2-L5. Solid intervertebral osseous fusion is
noted at L2-3 and L3-4. There is moderate to severe left-sided disc
space narrowing at L4-5. Severe disc space narrowing and vacuum disc
phenomenon are present at T11-12. Subacute appearing bilateral L1
transverse process fractures are again noted.

The conus medullaris terminates at the inferior L1 level. A dorsal
epidural fluid collection is again seen in the laminectomy bed from
L2-L4. There is also an unchanged more superficial fluid collection
which extends from L1-L4 and may communicate with the deeper
collection at the L2 level. Neither of these collections fill with
contrast to indicate pseudomeningocele. There is a small right-sided
lateral/ventral pseudomeningocele at the L2-3 disc space level
measuring 2.1 x 1.2 cm. Diffuse atherosclerosis is noted of the
abdominal aorta and its major branch vessels. Small calcified
gallstone.

T11-12:  Mild disc bulging without stenosis.

T12-L1: Disc bulging asymmetric to the left results in mild left
neural foraminal stenosis without spinal stenosis.

L1-2: Prior laminotomies. Retrolisthesis, diffuse disc bulging,
endplate spurring, and facet arthrosis result in moderate right
greater than left neural foraminal stenosis without spinal stenosis.
Right facet joint widening again noted.

L2-3: Prior posterior decompression and fusion. No stenosis. Small
pseudomeningocele extends into the inferomedial aspect of the right
neural foramen without compression of the exiting L2 nerve.

L3-4: Prior posterior decompression and fusion. Spinal canal is
widely patent. No definite neural foraminal stenosis.

L4-5: Prior posterior decompression. Dural calcification
posteriorly. No spinal stenosis. Disc bulging and spurring result in
mild bilateral neural foraminal stenosis.

L5-S1: Prior posterior decompression. Disc bulging, left foraminal
disc protrusion, and advanced facet arthrosis result in moderate
left neural foraminal stenosis without spinal stenosis.
IMPRESSION: 1. Extensive postsurgical changes as above. Fluid collections in the
laminectomy beds from L2-L4 and in the subcutaneous tissues from
L1-L4, potentially postoperative seromas. No opacification of these
collections to indicate pseudomeningocele.
2. Severe disc and endplate changes at L1-2 as described on
yesterday's study with grade [DATE] retrolisthesis. While this may
reflect advanced adjacent segment disease, prior or ongoing chronic
infection is not excluded.
3. No lumbar spinal stenosis.
4. Up to moderate multilevel neural foraminal stenosis as above.

## 2021-03-17 DEATH — deceased
